# Patient Record
Sex: Female | Born: 1975
Health system: Southern US, Community
[De-identification: ages and names within clinical notes are randomized; demographics above are authoritative.]

## PROBLEM LIST (undated history)

## (undated) DIAGNOSIS — F419 Anxiety disorder, unspecified: Secondary | ICD-10-CM

## (undated) DIAGNOSIS — B009 Herpesviral infection, unspecified: Secondary | ICD-10-CM

## (undated) DIAGNOSIS — G43111 Migraine with aura, intractable, with status migrainosus: Secondary | ICD-10-CM

## (undated) HISTORY — DX: Anxiety disorder, unspecified: F41.9

## (undated) HISTORY — DX: Migraine with aura, intractable, with status migrainosus: G43.111

## (undated) HISTORY — PX: WISDOM TOOTH EXTRACTION: SHX21

## (undated) HISTORY — DX: Herpesviral infection, unspecified: B00.9

---

## 2005-04-13 ENCOUNTER — Other Ambulatory Visit: Admission: RE | Admit: 2005-04-13 | Discharge: 2005-04-13 | Payer: Self-pay | Admitting: Family Medicine

## 2009-07-24 ENCOUNTER — Other Ambulatory Visit: Admission: RE | Admit: 2009-07-24 | Discharge: 2009-07-24 | Payer: Self-pay | Admitting: Family Medicine

## 2011-07-15 ENCOUNTER — Other Ambulatory Visit (HOSPITAL_COMMUNITY)
Admission: RE | Admit: 2011-07-15 | Discharge: 2011-07-15 | Disposition: A | Discharge status: Home / Self Care | Payer: BC Managed Care – PPO | Source: Ambulatory Visit | Attending: Family Medicine | Admitting: Family Medicine

## 2011-07-15 DIAGNOSIS — Z1159 Encounter for screening for other viral diseases: Secondary | ICD-10-CM | POA: Insufficient documentation

## 2011-07-15 DIAGNOSIS — Z124 Encounter for screening for malignant neoplasm of cervix: Secondary | ICD-10-CM | POA: Insufficient documentation

## 2012-07-18 ENCOUNTER — Other Ambulatory Visit: Payer: Self-pay | Admitting: Family Medicine

## 2012-07-18 ENCOUNTER — Other Ambulatory Visit (HOSPITAL_COMMUNITY)
Admission: RE | Admit: 2012-07-18 | Discharge: 2012-07-18 | Disposition: A | Discharge status: Home / Self Care | Payer: BC Managed Care – PPO | Source: Ambulatory Visit | Attending: Family Medicine | Admitting: Family Medicine

## 2012-07-18 DIAGNOSIS — Z0000 Encounter for general adult medical examination without abnormal findings: Secondary | ICD-10-CM | POA: Insufficient documentation

## 2012-07-18 DIAGNOSIS — Z1151 Encounter for screening for human papillomavirus (HPV): Secondary | ICD-10-CM | POA: Insufficient documentation

## 2014-04-05 ENCOUNTER — Emergency Department (HOSPITAL_BASED_OUTPATIENT_CLINIC_OR_DEPARTMENT_OTHER)
Admission: EM | Admit: 2014-04-05 | Discharge: 2014-04-05 | Disposition: A | Discharge status: Home / Self Care | Payer: BLUE CROSS/BLUE SHIELD | Attending: Emergency Medicine | Admitting: Emergency Medicine

## 2014-04-05 ENCOUNTER — Encounter (HOSPITAL_BASED_OUTPATIENT_CLINIC_OR_DEPARTMENT_OTHER): Payer: Self-pay

## 2014-04-05 ENCOUNTER — Emergency Department (HOSPITAL_BASED_OUTPATIENT_CLINIC_OR_DEPARTMENT_OTHER): Payer: BLUE CROSS/BLUE SHIELD

## 2014-04-05 DIAGNOSIS — J209 Acute bronchitis, unspecified: Secondary | ICD-10-CM | POA: Insufficient documentation

## 2014-04-05 DIAGNOSIS — Z79899 Other long term (current) drug therapy: Secondary | ICD-10-CM | POA: Insufficient documentation

## 2014-04-05 DIAGNOSIS — R05 Cough: Secondary | ICD-10-CM | POA: Diagnosis present

## 2014-04-05 DIAGNOSIS — Z880 Allergy status to penicillin: Secondary | ICD-10-CM | POA: Insufficient documentation

## 2014-04-05 DIAGNOSIS — R000 Tachycardia, unspecified: Secondary | ICD-10-CM | POA: Diagnosis not present

## 2014-04-05 DIAGNOSIS — J40 Bronchitis, not specified as acute or chronic: Secondary | ICD-10-CM

## 2014-04-05 DIAGNOSIS — Z793 Long term (current) use of hormonal contraceptives: Secondary | ICD-10-CM | POA: Insufficient documentation

## 2014-04-05 LAB — CBC WITH DIFFERENTIAL/PLATELET
BASOS PCT: 1 % (ref 0–1)
Basophils Absolute: 0.1 10*3/uL (ref 0.0–0.1)
EOS PCT: 2 % (ref 0–5)
Eosinophils Absolute: 0.2 10*3/uL (ref 0.0–0.7)
HEMATOCRIT: 39.2 % (ref 36.0–46.0)
HEMOGLOBIN: 13 g/dL (ref 12.0–15.0)
Lymphocytes Relative: 12 % (ref 12–46)
Lymphs Abs: 1.5 10*3/uL (ref 0.7–4.0)
MCH: 28.8 pg (ref 26.0–34.0)
MCHC: 33.2 g/dL (ref 30.0–36.0)
MCV: 86.9 fL (ref 78.0–100.0)
MONOS PCT: 5 % (ref 3–12)
Monocytes Absolute: 0.7 10*3/uL (ref 0.1–1.0)
Neutro Abs: 10.3 10*3/uL — ABNORMAL HIGH (ref 1.7–7.7)
Neutrophils Relative %: 80 % — ABNORMAL HIGH (ref 43–77)
Platelets: 424 10*3/uL — ABNORMAL HIGH (ref 150–400)
RBC: 4.51 MIL/uL (ref 3.87–5.11)
RDW: 13.3 % (ref 11.5–15.5)
WBC: 12.8 10*3/uL — ABNORMAL HIGH (ref 4.0–10.5)

## 2014-04-05 LAB — BASIC METABOLIC PANEL
Anion gap: 2 — ABNORMAL LOW (ref 5–15)
BUN: 11 mg/dL (ref 6–23)
CHLORIDE: 110 mmol/L (ref 96–112)
CO2: 22 mmol/L (ref 19–32)
CREATININE: 0.51 mg/dL (ref 0.50–1.10)
Calcium: 8.7 mg/dL (ref 8.4–10.5)
GFR calc Af Amer: >90 mL/min (ref >90)
GFR calc non Af Amer: >90 mL/min (ref >90)
GLUCOSE: 104 mg/dL — AB (ref 70–99)
Potassium: 3.8 mmol/L (ref 3.5–5.1)
Sodium: 134 mmol/L — ABNORMAL LOW (ref 135–145)

## 2014-04-05 LAB — D-DIMER, QUANTITATIVE (NOT AT ARMC): D DIMER QUANT: 0.34 ug{FEU}/mL (ref 0.00–0.48)

## 2014-04-05 MED ORDER — AZITHROMYCIN 250 MG PO TABS: 250.0000 mg | ORAL_TABLET | Freq: Every day | ORAL | Status: DC

## 2014-04-05 MED ORDER — ALBUTEROL SULFATE HFA 108 (90 BASE) MCG/ACT IN AERS: 1.0000 | INHALATION_SPRAY | Freq: Four times a day (QID) | RESPIRATORY_TRACT | Status: DC | PRN

## 2014-04-05 NOTE — ED Notes (Signed)
C/o non prod cough and SOB x 2 days-sent from Urgent Care

## 2014-04-05 NOTE — Discharge Instructions (Signed)

## 2014-04-05 NOTE — ED Notes (Signed)
Pt appears mildly shob with speaking; denies recent travel, leg pain, chest pain

## 2014-04-05 NOTE — ED Provider Notes (Signed)
CSN: [id number]     Arrival date & time 04/05/14  1214 History   First MD Initiated Contact with Patient 04/05/14 1500     Chief Complaint  Patient presents with  . Cough     (Consider location/radiation/quality/duration/timing/severity/associated sxs/prior Treatment) HPI Comments: Patient reports shortness of breath for the past 2 days with nonproductive cough. She was sent from urgent care because her "pulse was high and pulse ox was low". She denies any productivity to her cough. She denies chest pain unless she coughs. Denies fever. No abdominal pain, nausea vomiting. No urinary symptoms. No leg pain or leg swelling. She endorses some nasal drainage and rhinorrhea. She is on birth control. Denies any recent travel.  The history is provided by the patient.    History reviewed. No pertinent past medical history. History reviewed. No pertinent past surgical history. No family history on file. History  Substance Use Topics  . Smoking status: Never Smoker   . Smokeless tobacco: Not on file  . Alcohol Use: No   OB History    No data available     Review of Systems  Constitutional: Negative for fever, activity change and appetite change.  HENT: Negative for congestion.   Respiratory: Positive for cough and shortness of breath. Negative for chest tightness.   Cardiovascular: Negative for chest pain.  Gastrointestinal: Negative for nausea, vomiting and abdominal pain.  Genitourinary: Negative for dysuria and hematuria.  Musculoskeletal: Negative for myalgias and arthralgias.  Skin: Negative for rash.  Neurological: Negative for dizziness and weakness.  A complete 10 system review of systems was obtained and all systems are negative except as noted in the HPI and PMH.      Allergies  Amoxil  Home Medications   Prior to Admission medications   Medication Sig Start Date End Date Taking? Authorizing Provider  acyclovir (ZOVIRAX) 400 MG tablet Take 400 mg by mouth 5 (five)  times daily.   Yes Historical Provider, MD  benzonatate (TESSALON) 100 MG capsule Take by mouth 3 (three) times daily as needed for cough.   Yes Historical Provider, MD  cetirizine (ZYRTEC) 10 MG tablet Take 10 mg by mouth daily.   Yes Historical Provider, MD  FLUTICASONE PROPIONATE, NASAL, NA Place into the nose.   Yes Historical Provider, MD  albuterol (PROVENTIL HFA;VENTOLIN HFA) 108 (90 BASE) MCG/ACT inhaler Inhale 1-2 puffs into the lungs every 6 (six) hours as needed for wheezing or shortness of breath. 04/05/14   Ezequiel Essex, MD  azithromycin (ZITHROMAX) 250 MG tablet Take 1 tablet (250 mg total) by mouth daily. Take first 2 tablets together, then 1 every day until finished. 04/05/14   Ezequiel Essex, MD   BP 104/66 mmHg  Pulse 92  Temp(Src) 98.9 F (37.2 C) (Oral)  Resp 16  Ht 5\' 7"  (1.702 m)  Wt 183 lb (83.008 kg)  BMI 28.66 kg/m2  SpO2 98%  LMP 03/23/2014 Physical Exam  Constitutional: She is oriented to person, place, and time. She appears well-developed and well-nourished. No distress.  Congested voice  HENT:  Head: Normocephalic and atraumatic.  Mouth/Throat: Oropharynx is clear and moist. No oropharyngeal exudate.  Eyes: Conjunctivae and EOM are normal. Pupils are equal, round, and reactive to light.  Neck: Normal range of motion. Neck supple.  No meningismus.  Cardiovascular: Normal rate, regular rhythm, normal heart sounds and intact distal pulses.   No murmur heard. Pulmonary/Chest: Effort normal and breath sounds normal. No respiratory distress.  Abdominal: Soft. There is no tenderness. There  is no rebound and no guarding.  Musculoskeletal: Normal range of motion. She exhibits no edema or tenderness.  Neurological: She is alert and oriented to person, place, and time. No cranial nerve deficit. She exhibits normal muscle tone. Coordination normal.  No ataxia on finger to nose bilaterally. No pronator drift. 5/5 strength throughout. CN 2-12 intact. Negative Romberg.  Equal grip strength. Sensation intact. Gait is normal.   Skin: Skin is warm.  Psychiatric: She has a normal mood and affect. Her behavior is normal.  Nursing note and vitals reviewed.   ED Course  Procedures (including critical care time) Labs Review Labs Reviewed  BASIC METABOLIC PANEL - Abnormal; Notable for the following:    Sodium 134 (*)    Glucose, Bld 104 (*)    Anion gap 2 (*)    All other components within normal limits  CBC WITH DIFFERENTIAL/PLATELET - Abnormal; Notable for the following:    WBC 12.8 (*)    Platelets 424 (*)    Neutrophils Relative % 80 (*)    Neutro Abs 10.3 (*)    All other components within normal limits  D-DIMER, QUANTITATIVE    Imaging Review Dg Chest 2 View  04/05/2014   CLINICAL DATA:  Shortness of breath for 2 days, throat tightness  EXAM: CHEST  2 VIEW  COMPARISON:  None.  FINDINGS: The heart size and mediastinal contours are within normal limits. Both lungs are clear. The visualized skeletal structures are unremarkable.  IMPRESSION: No active cardiopulmonary disease.   Electronically Signed   By: Inez Catalina M.D.   On: 04/05/2014 14:47     EKG Interpretation   Date/Time:  Thursday April 05 2014 12:30:57 EST Ventricular Rate:  100 PR Interval:  144 QRS Duration: 66 QT Interval:  340 QTC Calculation: 438 R Axis:   50 Text Interpretation:  Normal sinus rhythm Nonspecific T wave abnormality  Abnormal ECG No previous ECGs available Confirmed by Farheen Pfahler  MD, Layden Caterino  [phone]) on 04/05/2014 4:33:11 PM      MDM   Final diagnoses:  Bronchitis   2 days of shortness of breath with cough and tachycardia from urgent care. History of birth control use.  Lungs are clear.  Chest x-ray negative. D-dimer negative.  Patient no distress. We'll treat for bronchitis. Continue Tessalon. We'll add bronchodilator. Low suspicion for ACS or PE. Return precautions discussed.  Ezequiel Essex, MD 04/05/14 [phone]

## 2014-07-29 ENCOUNTER — Encounter (HOSPITAL_COMMUNITY): Payer: Self-pay

## 2014-07-29 ENCOUNTER — Inpatient Hospital Stay (HOSPITAL_COMMUNITY)
Admission: EM | Admit: 2014-07-29 | Discharge: 2014-07-31 | DRG: 918 | Disposition: A | Discharge status: Home / Self Care | Payer: 59 | Attending: Internal Medicine | Admitting: Internal Medicine

## 2014-07-29 DIAGNOSIS — W5911XA Bitten by nonvenomous snake, initial encounter: Secondary | ICD-10-CM

## 2014-07-29 DIAGNOSIS — T63001A Toxic effect of unspecified snake venom, accidental (unintentional), initial encounter: Secondary | ICD-10-CM | POA: Diagnosis not present

## 2014-07-29 DIAGNOSIS — Z79899 Other long term (current) drug therapy: Secondary | ICD-10-CM

## 2014-07-29 DIAGNOSIS — Z881 Allergy status to other antibiotic agents status: Secondary | ICD-10-CM

## 2014-07-29 DIAGNOSIS — T63091A Toxic effect of venom of other snake, accidental (unintentional), initial encounter: Principal | ICD-10-CM | POA: Diagnosis present

## 2014-07-29 LAB — I-STAT CHEM 8, ED
BUN: 12 mg/dL (ref 6–20)
Calcium, Ion: 1.13 mmol/L (ref 1.12–1.23)
Chloride: 104 mmol/L (ref 101–111)
Creatinine, Ser: 0.7 mg/dL (ref 0.44–1.00)
GLUCOSE: 149 mg/dL — AB (ref 65–99)
HCT: 41 % (ref 36.0–46.0)
Hemoglobin: 13.9 g/dL (ref 12.0–15.0)
Potassium: 3.2 mmol/L — ABNORMAL LOW (ref 3.5–5.1)
SODIUM: 139 mmol/L (ref 135–145)
TCO2: 18 mmol/L (ref 0–100)

## 2014-07-29 LAB — CBC WITH DIFFERENTIAL/PLATELET
BASOS ABS: 0.1 10*3/uL (ref 0.0–0.1)
BASOS PCT: 1 % (ref 0–1)
EOS ABS: 0.3 10*3/uL (ref 0.0–0.7)
EOS PCT: 3 % (ref 0–5)
HEMATOCRIT: 37.8 % (ref 36.0–46.0)
HEMOGLOBIN: 12.8 g/dL (ref 12.0–15.0)
Lymphocytes Relative: 32 % (ref 12–46)
Lymphs Abs: 2.8 10*3/uL (ref 0.7–4.0)
MCH: 29.2 pg (ref 26.0–34.0)
MCHC: 33.9 g/dL (ref 30.0–36.0)
MCV: 86.3 fL (ref 78.0–100.0)
MONO ABS: 0.5 10*3/uL (ref 0.1–1.0)
Monocytes Relative: 5 % (ref 3–12)
NEUTROS ABS: 5.1 10*3/uL (ref 1.7–7.7)
NEUTROS PCT: 59 % (ref 43–77)
Platelets: 361 10*3/uL (ref 150–400)
RBC: 4.38 MIL/uL (ref 3.87–5.11)
RDW: 13.9 % (ref 11.5–15.5)
WBC: 8.7 10*3/uL (ref 4.0–10.5)

## 2014-07-29 LAB — PROTIME-INR
INR: 1.06 (ref 0.00–1.49)
Prothrombin Time: 14 seconds (ref 11.6–15.2)

## 2014-07-29 LAB — FIBRINOGEN: Fibrinogen: 339 mg/dL (ref 204–475)

## 2014-07-29 MED ORDER — HYDROMORPHONE HCL 1 MG/ML IJ SOLN
1.0000 mg | Freq: Once | INTRAMUSCULAR | Status: AC
  Administered 2014-07-29: 1 mg via INTRAVENOUS
  Filled 2014-07-29: qty 1

## 2014-07-29 MED ORDER — HYDROMORPHONE HCL 1 MG/ML IJ SOLN
1.0000 mg | Freq: Once | INTRAMUSCULAR | Status: AC
  Administered 2014-07-30: 1 mg via INTRAVENOUS
  Filled 2014-07-29: qty 1

## 2014-07-29 MED ORDER — HYDROMORPHONE HCL 1 MG/ML IJ SOLN
0.5000 mg | Freq: Once | INTRAMUSCULAR | Status: AC
  Administered 2014-07-29: 0.5 mg via INTRAVENOUS
  Filled 2014-07-29: qty 1

## 2014-07-29 NOTE — ED Notes (Signed)
Onset 7:30p pt was cleaning out gutter and got bit by snake. Husband has picture of snake on phone.  Bite is on left thumb, left thumb and index finger numb.  Bruising noted to thumb.

## 2014-07-29 NOTE — ED Provider Notes (Signed)
CSN: [id number]     Arrival date & time 07/29/14  1946 History   First MD Initiated Contact with Patient 07/29/14 1956     Chief Complaint  Patient presents with  . Snake Bite     (Consider location/radiation/quality/duration/timing/severity/associated sxs/prior Treatment) HPI Emily Norman is a 39 y.o. female with no medical problems, presents to emergency department after snake bite to left hand. Patient states she was cleaning the gutters when a small snake bit her at the proximal left thumb. Picture of this snake was obtained, appears to look like a copperhead. Patient states area immediately swelled up and bruised. She came straight to emergency department. She reports pain to this area and numbness sensation radiating up to the wrist. She denies any fever or chills. There is no bleeding at the site. There is only one bite. No other injuries. Patient is not on any blood thinners.   History reviewed. No pertinent past medical history. History reviewed. No pertinent past surgical history. History reviewed. No pertinent family history. History  Substance Use Topics  . Smoking status: Never Smoker   . Smokeless tobacco: Not on file  . Alcohol Use: No   OB History    No data available     Review of Systems  Constitutional: Negative for fever and chills.  Respiratory: Negative for cough, chest tightness and shortness of breath.   Cardiovascular: Negative for chest pain, palpitations and leg swelling.  Gastrointestinal: Negative for nausea, vomiting, abdominal pain and diarrhea.  Musculoskeletal: Negative for myalgias, arthralgias, neck pain and neck stiffness.  Skin: Positive for color change and wound. Negative for rash.  Neurological: Negative for dizziness, weakness and headaches.  All other systems reviewed and are negative.     Allergies  Amoxil  Home Medications   Prior to Admission medications   Medication Sig Start Date End Date Taking? Authorizing Provider   acyclovir (ZOVIRAX) 400 MG tablet Take 400 mg by mouth 5 (five) times daily.    Historical Provider, MD  albuterol (PROVENTIL HFA;VENTOLIN HFA) 108 (90 BASE) MCG/ACT inhaler Inhale 1-2 puffs into the lungs every 6 (six) hours as needed for wheezing or shortness of breath. 04/05/14   Ezequiel Essex, MD  azithromycin (ZITHROMAX) 250 MG tablet Take 1 tablet (250 mg total) by mouth daily. Take first 2 tablets together, then 1 every day until finished. 04/05/14   Ezequiel Essex, MD  benzonatate (TESSALON) 100 MG capsule Take by mouth 3 (three) times daily as needed for cough.    Historical Provider, MD  cetirizine (ZYRTEC) 10 MG tablet Take 10 mg by mouth daily.    Historical Provider, MD  FLUTICASONE PROPIONATE, NASAL, NA Place into the nose.    Historical Provider, MD   BP 120/62 mmHg  Pulse 97  Temp(Src) 98.3 F (36.8 C)  Resp 16  Ht 5\' 7"  (1.702 m)  Wt 184 lb 1.6 oz (83.507 kg)  BMI 28.83 kg/m2  SpO2 98%  LMP 07/29/2014 Physical Exam  Constitutional: She appears well-developed and well-nourished. No distress.  HENT:  Head: Normocephalic.  Eyes: Conjunctivae are normal.  Neck: Neck supple.  Cardiovascular: Normal rate, regular rhythm and normal heart sounds.   Pulmonary/Chest: Effort normal and breath sounds normal. No respiratory distress. She has no wheezes. She has no rales.  Musculoskeletal: She exhibits no edema.  Mild swelling and small contusion noted to the proximal left thumb with one puncture wound. No bleeding. Swelling extends to the MCP joint and around. Her eminence. It does not cross the  wrist at this time. Full range of motion of all fingers, Refill less than 2 seconds distally. Bruising is localized just to the bite area.  Neurological: She is alert.  Skin: Skin is warm and dry.  Psychiatric: She has a normal mood and affect. Her behavior is normal.  Nursing note and vitals reviewed.   ED Course  Procedures (including critical care time) Labs Review Labs Reviewed   CBC - Abnormal; Notable for the following:    WBC 11.9 (*)    All other components within normal limits  COMPREHENSIVE METABOLIC PANEL - Abnormal; Notable for the following:    CO2 19 (*)    Glucose, Bld 127 (*)    Calcium 8.1 (*)    Total Protein 6.2 (*)    Albumin 3.4 (*)    All other components within normal limits  CBC WITH DIFFERENTIAL/PLATELET - Abnormal; Notable for the following:    WBC 14.0 (*)    Hemoglobin 11.9 (*)    Neutrophils Relative % 89 (*)    Neutro Abs 12.4 (*)    Lymphocytes Relative 8 (*)    All other components within normal limits  COMPREHENSIVE METABOLIC PANEL - Abnormal; Notable for the following:    Glucose, Bld 105 (*)    Calcium 8.8 (*)    Total Protein 6.3 (*)    Albumin 3.3 (*)    All other components within normal limits  I-STAT CHEM 8, ED - Abnormal; Notable for the following:    Potassium 3.2 (*)    Glucose, Bld 149 (*)    All other components within normal limits  MRSA PCR SCREENING  CBC WITH DIFFERENTIAL/PLATELET  FIBRINOGEN  PROTIME-INR  PROTIME-INR  FIBRINOGEN  PROTIME-INR  FIBRINOGEN  PROTIME-INR    Imaging Review No results found.   EKG Interpretation Final diagnoses:  Snake bite, accidental or unintentional, initial encounter        MDM   Final diagnoses:  Snake bite, accidental or unintentional, initial encounter      8:20 PM Spoke with poison control, advised to elevate the extremity, marked the area of swelling, watch for swelling that progresses past the wrist joint. Pain control. Lab work  is pending. Vital signs are normal. Pain medications are ordered.  9:14 PM Patient complaining of increasing pain, will order another dose of Dilaudid. Swelling still contained down through them first metacarpal, it does not cross the wrist joint at this time.  11:03 PM Pain now well controlled. Swelling not progressed past wrist. VS normal. Will continue to monitor.   12:46 AM swelling to the wrist. VS remain normal.  Discussed with Dr. Aline Brochure. Hold off on crofab, swelling is minimal. Will need further observation and serial exams. Will admit to [facility].   Filed Vitals:   07/29/14 2245 07/29/14 2300 07/29/14 2315 07/29/14 2330  BP: 109/66 116/62 107/70 116/66  Pulse: 64 74 66 65  Temp:      Resp:      Height:      Weight:      SpO2: 92% 97% 94% 94%      Jeannett Senior, PA-C 07/31/14 [place], MD 08/01/14 [phone]

## 2014-07-30 DIAGNOSIS — T63001A Toxic effect of unspecified snake venom, accidental (unintentional), initial encounter: Secondary | ICD-10-CM | POA: Diagnosis present

## 2014-07-30 DIAGNOSIS — Z881 Allergy status to other antibiotic agents status: Secondary | ICD-10-CM | POA: Diagnosis not present

## 2014-07-30 DIAGNOSIS — Z79899 Other long term (current) drug therapy: Secondary | ICD-10-CM | POA: Diagnosis not present

## 2014-07-30 DIAGNOSIS — T63091A Toxic effect of venom of other snake, accidental (unintentional), initial encounter: Secondary | ICD-10-CM | POA: Diagnosis present

## 2014-07-30 DIAGNOSIS — T63004A Toxic effect of unspecified snake venom, undetermined, initial encounter: Secondary | ICD-10-CM | POA: Diagnosis not present

## 2014-07-30 LAB — CBC
HEMATOCRIT: 36.3 % (ref 36.0–46.0)
Hemoglobin: 12.2 g/dL (ref 12.0–15.0)
MCH: 29 pg (ref 26.0–34.0)
MCHC: 33.6 g/dL (ref 30.0–36.0)
MCV: 86.4 fL (ref 78.0–100.0)
Platelets: 353 10*3/uL (ref 150–400)
RBC: 4.2 MIL/uL (ref 3.87–5.11)
RDW: 14 % (ref 11.5–15.5)
WBC: 11.9 10*3/uL — ABNORMAL HIGH (ref 4.0–10.5)

## 2014-07-30 LAB — CBC WITH DIFFERENTIAL/PLATELET
BASOS ABS: 0 10*3/uL (ref 0.0–0.1)
Basophils Relative: 0 % (ref 0–1)
Eosinophils Absolute: 0 10*3/uL (ref 0.0–0.7)
Eosinophils Relative: 0 % (ref 0–5)
HCT: 36.6 % (ref 36.0–46.0)
HEMOGLOBIN: 11.9 g/dL — AB (ref 12.0–15.0)
LYMPHS ABS: 1.2 10*3/uL (ref 0.7–4.0)
Lymphocytes Relative: 8 % — ABNORMAL LOW (ref 12–46)
MCH: 28.6 pg (ref 26.0–34.0)
MCHC: 32.5 g/dL (ref 30.0–36.0)
MCV: 88 fL (ref 78.0–100.0)
MONOS PCT: 3 % (ref 3–12)
Monocytes Absolute: 0.5 10*3/uL (ref 0.1–1.0)
NEUTROS ABS: 12.4 10*3/uL — AB (ref 1.7–7.7)
NEUTROS PCT: 89 % — AB (ref 43–77)
Platelets: 318 10*3/uL (ref 150–400)
RBC: 4.16 MIL/uL (ref 3.87–5.11)
RDW: 13.9 % (ref 11.5–15.5)
WBC: 14 10*3/uL — ABNORMAL HIGH (ref 4.0–10.5)

## 2014-07-30 LAB — COMPREHENSIVE METABOLIC PANEL
ALT: 17 U/L (ref 14–54)
AST: 23 U/L (ref 15–41)
Albumin: 3.4 g/dL — ABNORMAL LOW (ref 3.5–5.0)
Alkaline Phosphatase: 48 U/L (ref 38–126)
Anion gap: 9 (ref 5–15)
BILIRUBIN TOTAL: 0.5 mg/dL (ref 0.3–1.2)
BUN: 9 mg/dL (ref 6–20)
CO2: 19 mmol/L — ABNORMAL LOW (ref 22–32)
Calcium: 8.1 mg/dL — ABNORMAL LOW (ref 8.9–10.3)
Chloride: 108 mmol/L (ref 101–111)
Creatinine, Ser: 0.61 mg/dL (ref 0.44–1.00)
GFR calc Af Amer: >60 mL/min (ref >60)
GLUCOSE: 127 mg/dL — AB (ref 65–99)
POTASSIUM: 3.7 mmol/L (ref 3.5–5.1)
SODIUM: 136 mmol/L (ref 135–145)
Total Protein: 6.2 g/dL — ABNORMAL LOW (ref 6.5–8.1)

## 2014-07-30 LAB — FIBRINOGEN
FIBRINOGEN: 306 mg/dL (ref 204–475)
Fibrinogen: 303 mg/dL (ref 204–475)

## 2014-07-30 LAB — PROTIME-INR
INR: 1.15 (ref 0.00–1.49)
INR: 1.08 (ref 0.00–1.49)
Prothrombin Time: 14.8 seconds (ref 11.6–15.2)
Prothrombin Time: 14.2 seconds (ref 11.6–15.2)

## 2014-07-30 LAB — MRSA PCR SCREENING: MRSA by PCR: NEGATIVE

## 2014-07-30 MED ORDER — SODIUM CHLORIDE 0.9 % IV SOLN
2.0000 | Freq: Once | INTRAVENOUS | Status: AC
  Administered 2014-07-30: 36 mL via INTRAVENOUS
  Filled 2014-07-30: qty 36

## 2014-07-30 MED ORDER — SODIUM CHLORIDE 0.9 % IV SOLN
4.0000 | Freq: Once | INTRAVENOUS | Status: AC
  Administered 2014-07-30: 72 mL via INTRAVENOUS
  Filled 2014-07-30: qty 72

## 2014-07-30 MED ORDER — HYDROMORPHONE HCL 1 MG/ML IJ SOLN
2.0000 mg | Freq: Once | INTRAMUSCULAR | Status: AC
  Administered 2014-07-30: 2 mg via INTRAVENOUS
  Filled 2014-07-30: qty 2

## 2014-07-30 MED ORDER — ONDANSETRON HCL 4 MG PO TABS: 4.0000 mg | ORAL_TABLET | Freq: Four times a day (QID) | ORAL | Status: DC | PRN

## 2014-07-30 MED ORDER — SODIUM CHLORIDE 0.9 % IV SOLN
Freq: Once | INTRAVENOUS | Status: AC
  Administered 2014-07-30: 125 mL/h via INTRAVENOUS

## 2014-07-30 MED ORDER — SODIUM CHLORIDE 0.9 % IV SOLN
INTRAVENOUS | Status: DC
  Administered 2014-07-31: 08:00:00 via INTRAVENOUS

## 2014-07-30 MED ORDER — HYDROMORPHONE HCL 1 MG/ML IJ SOLN
0.5000 mg | INTRAMUSCULAR | Status: DC | PRN
  Administered 2014-07-30: 1 mg via INTRAVENOUS
  Administered 2014-07-30 (×2): 2 mg via INTRAVENOUS
  Administered 2014-07-30: 1 mg via INTRAVENOUS
  Filled 2014-07-30 (×3): qty 2
  Filled 2014-07-30: qty 1

## 2014-07-30 MED ORDER — SODIUM CHLORIDE 0.9 % IV BOLUS (SEPSIS)
1000.0000 mL | Freq: Once | INTRAVENOUS | Status: AC
  Administered 2014-07-30: 1000 mL via INTRAVENOUS

## 2014-07-30 MED ORDER — ONDANSETRON HCL 4 MG/2ML IJ SOLN
4.0000 mg | Freq: Four times a day (QID) | INTRAMUSCULAR | Status: DC | PRN
Start: 2014-07-30 — End: 2014-07-31
  Administered 2014-07-30 (×2): 4 mg via INTRAVENOUS
  Filled 2014-07-30 (×2): qty 2

## 2014-07-30 MED ORDER — DOCUSATE SODIUM 100 MG PO CAPS
100.0000 mg | ORAL_CAPSULE | Freq: Two times a day (BID) | ORAL | Status: DC
  Administered 2014-07-30 – 2014-07-31 (×2): 100 mg via ORAL
  Filled 2014-07-30 (×2): qty 1

## 2014-07-30 MED ORDER — DOXYCYCLINE HYCLATE 100 MG PO TABS
100.0000 mg | ORAL_TABLET | Freq: Two times a day (BID) | ORAL | Status: DC
  Administered 2014-07-30 – 2014-07-31 (×3): 100 mg via ORAL
  Filled 2014-07-30 (×3): qty 1

## 2014-07-30 NOTE — Progress Notes (Signed)
Patient seen and examined  Still complaining off a significant amount of pain in her left hand, although better than last night  Discussed with poison control, they recommend continued doses of  CroFab 3 every 6 hours to complete treatment  Started the patient on doxycycline for secondary infection at the site of the bite , since white count is increasing  If no improvement in pain by tomorrow, then we will consider an MRI of the left hand

## 2014-07-30 NOTE — H&P (Addendum)
[facility] History and Physical  Emily Norman YSA:[id number] DOB: December 15, 1975 DOA: 07/29/2014  Referring physician:  PCP: Cari Caraway, MD  Specialists:   Chief Complaint: Bitten by copperhead snake  HPI: Emily Norman is a 39 y.o. female with no medical problems, presents to emergency department after snake bite to left hand. Patient states she was cleaning the gutters when a small snake bit her at the proximal left thumb. Picture of this snake was obtained, appears to look like a copperhead. Patient states area immediately swelled up and bruised. She came straight to emergency department. She reports pain to this area and numbness sensation radiating up to the wrist. She denies any fever or chills. There is no bleeding at the site. There is only one bite. No other injuries. Patient is not on any blood thinners.  ADDENDUM; patient now having pain to palpation in left axilla   Review of Systems: The patient denies anorexia, fever, weight loss,, vision loss, decreased hearing, hoarseness, chest pain, syncope, dyspnea on exertion, balance deficits, hemoptysis, abdominal pain, melena, hematochezia, severe indigestion/heartburn, hematuria, incontinence, genital sores, muscle weakness, suspicious skin lesions, transient blindness, difficulty walking, depression, unusual weight change, abnormal bleeding, enlarged lymph nodes, angioedema, and breast masses.    TRAVEL HISTORY:  NA  Consultants:  [facility] (information in chart)   Procedure/Significant Events:  NA    Culture  NA   Antibiotics:  NA   DVT prophylaxis:  SCD  Devices     LINES / TUBES:     History reviewed. No pertinent past medical history. History reviewed. No pertinent past surgical history. Social History:  reports that she has never smoked. She does not have any smokeless tobacco history on file. She reports that she does not drink alcohol or use illicit drugs. where does patient  live--home, ALF, SNF? and with whom if at home? Home Can patient participate in ADLs? Yes  Allergies  Allergen Reactions  . Amoxil [Amoxicillin] Other (See Comments)    Pt had several side effects from this but does not recall which ones    History reviewed. No pertinent family history. spoke with patient/family member and stated no pertinent family history.  Prior to Admission medications   Medication Sig Start Date End Date Taking? Authorizing Provider  acyclovir (ZOVIRAX) 400 MG tablet Take 800 mg by mouth daily after lunch.    Yes Historical Provider, MD  albuterol (PROVENTIL HFA;VENTOLIN HFA) 108 (90 BASE) MCG/ACT inhaler Inhale 1-2 puffs into the lungs every 6 (six) hours as needed for wheezing or shortness of breath. 04/05/14  Yes Ezequiel Essex, MD  ibuprofen (ADVIL,MOTRIN) 200 MG tablet Take 600 mg by mouth daily as needed (pain).   Yes Historical Provider, MD  Norethindrone Acetate-Ethinyl Estradiol (GILDESS 1.5/30) 1.5-30 MG-MCG tablet Take 1 tablet by mouth See admin instructions. Take 1 tablet by mouth for 21 days,  hold for 7 days, then repeat.   Yes Historical Provider, MD  OVER THE COUNTER MEDICATION Apply 1 application topically 2 (two) times daily as needed (acne blemishes). Over the counter acne cream   Yes Historical Provider, MD   Physical Exam: Filed Vitals:   07/29/14 2245 07/29/14 2300 07/29/14 2315 07/29/14 2330  BP: 109/66 116/62 107/70 116/66  Pulse: 64 74 66 65  Temp:      Resp:      Height:      Weight:      SpO2: 92% 97% 94% 94%     General:  A/O 4, having significant left hand  pain rated 7/10, swelling present  Constitutional;  negative , rash, negative, negative lump/bone/masses  Eyes: Negative headache, negative retinal hemorrhage  ENT: Negative Runny nose, negative ear pain, negative tinnitus, negative gingival bleeding  Gastrointestinal; negative abdominal pain, negative dysphagia, negative bloating,Soft, nontender nondistended plus bowel  sound   Neck:  Negative scars, masses, torticollis, lymphadenopathy, JVD  Cardiovascular: RRR, negative murmurs, rubs, gallops normal S1/S2, negative S3  Respiratory: Clear to auscultation bilateral, negative rales, rhonchi, negative wheezing, crackles  Skin ; negative rash, positive single puncture mark tip of left thumb  Musculoskeletal:  Positive left hand pain/swelling, positive DIP joint swelling left thumb, positive  decreased range of motion left thumb   Psychiatric:  Negative depression, negative anxiety, negative fatigue, negative mania  Neurologic:  Cranial nerves II through XII intact, tongue/uvula midline, all extremities muscle strength 5/5, sensation intact throughout,negative dysarthria, negative expressive aphasia, negative receptive aphasia.   Hemolytic/lymphatic; negative purpura, petechia,  Allergic/immunologic; negative Difficulty breathing" or "choking. Negative Swelling or pain at groin, axilla  or neck (swollen lymph nodes/glands ),    Labs on Admission:  Basic Metabolic Panel:  Recent Labs Lab 07/29/14 2032  NA 139  K 3.2*  CL 104  GLUCOSE 149*  BUN 12  CREATININE 0.70   Liver Function Tests: No results for input(s): AST, ALT, ALKPHOS, BILITOT, PROT, ALBUMIN in the last 168 hours. No results for input(s): LIPASE, AMYLASE in the last 168 hours. No results for input(s): AMMONIA in the last 168 hours. CBC:  Recent Labs Lab 07/29/14 2018 07/29/14 2032  WBC 8.7  --   NEUTROABS 5.1  --   HGB 12.8 13.9  HCT 37.8 41.0  MCV 86.3  --   PLT 361  --    Cardiac Enzymes: No results for input(s): CKTOTAL, CKMB, CKMBINDEX, TROPONINI in the last 168 hours.  BNP (last 3 results) No results for input(s): BNP in the last 8760 hours.  ProBNP (last 3 results) No results for input(s): PROBNP in the last 8760 hours.  CBG: No results for input(s): GLUCAP in the last 168 hours.  Radiological Exams on Admission: No results found.  EKG:  N/A  Assessment/Plan Active Problems:   * No active hospital problems. *  Snakebite (copperhead) -Patient currently receiving supportive care normal saline 65ml/hr -Dilaudid 0.5-2 mg IV PRN q 4hr pain -If swelling and pain cross left wrist then would start anti-venom ADDENDUM; with increasing pain now extending into left axilla will start anti-venom    Code Status: Full Family Communication: Family present Disposition Plan: Resolution swelling from snakebite   Care during the described time interval was provided by me .  I have reviewed this patient's available data, including medical history, events of note, physical examination, and all test results as part of my evaluation. I have personally reviewed and interpreted all radiology studies.    Time spent: 70 minutes  Allie Bossier [facility] Pager [phone]  If 7PM-7AM, please contact night-coverage [email or web address] Password [facility] 07/30/2014, 12:37 AM

## 2014-07-31 LAB — COMPREHENSIVE METABOLIC PANEL
ALT: 17 U/L (ref 14–54)
AST: 20 U/L (ref 15–41)
Albumin: 3.3 g/dL — ABNORMAL LOW (ref 3.5–5.0)
Alkaline Phosphatase: 60 U/L (ref 38–126)
Anion gap: 6 (ref 5–15)
BILIRUBIN TOTAL: 0.4 mg/dL (ref 0.3–1.2)
BUN: 8 mg/dL (ref 6–20)
CHLORIDE: 110 mmol/L (ref 101–111)
CO2: 24 mmol/L (ref 22–32)
CREATININE: 0.57 mg/dL (ref 0.44–1.00)
Calcium: 8.8 mg/dL — ABNORMAL LOW (ref 8.9–10.3)
GFR calc Af Amer: >60 mL/min (ref >60)
GFR calc non Af Amer: >60 mL/min (ref >60)
Glucose, Bld: 105 mg/dL — ABNORMAL HIGH (ref 65–99)
Potassium: 4.2 mmol/L (ref 3.5–5.1)
Sodium: 140 mmol/L (ref 135–145)
Total Protein: 6.3 g/dL — ABNORMAL LOW (ref 6.5–8.1)

## 2014-07-31 LAB — PROTIME-INR
INR: 1.01 (ref 0.00–1.49)
Prothrombin Time: 13.5 seconds (ref 11.6–15.2)

## 2014-07-31 MED ORDER — OXYCODONE HCL 5 MG PO TABS: 5.0000 mg | ORAL_TABLET | Freq: Three times a day (TID) | ORAL | Status: DC | PRN

## 2014-07-31 MED ORDER — DOCUSATE SODIUM 100 MG PO CAPS: 100.0000 mg | ORAL_CAPSULE | Freq: Two times a day (BID) | ORAL | Status: DC

## 2014-07-31 MED ORDER — DOXYCYCLINE HYCLATE 100 MG PO TABS: 100.0000 mg | ORAL_TABLET | Freq: Two times a day (BID) | ORAL | Status: DC

## 2014-07-31 NOTE — Discharge Summary (Signed)
Physician Discharge Summary  Emily Norman MRN: [id number] DOB/AGE: [birth year] 39 y.o.  PCP: Cari Caraway, MD   Admit date: 07/29/2014 Discharge date: 07/31/2014  Discharge Diagnoses:   Active Problems:   Snake bite poisoning    Follow-up recommendations Follow-up with PCP in 3-5 days , including although additional recommended appointments as below Follow-up CBC, CMP, INR in 3-5 days      Medication List    STOP taking these medications        ibuprofen 200 MG tablet  Commonly known as:  ADVIL,MOTRIN      TAKE these medications        acyclovir 400 MG tablet  Commonly known as:  ZOVIRAX  Take 800 mg by mouth daily after lunch.     albuterol 108 (90 BASE) MCG/ACT inhaler  Commonly known as:  PROVENTIL HFA;VENTOLIN HFA  Inhale 1-2 puffs into the lungs every 6 (six) hours as needed for wheezing or shortness of breath.     docusate sodium 100 MG capsule  Commonly known as:  COLACE  Take 1 capsule (100 mg total) by mouth 2 (two) times daily.     doxycycline 100 MG tablet  Commonly known as:  VIBRA-TABS  Take 1 tablet (100 mg total) by mouth every 12 (twelve) hours.     GILDESS 1.5/30 1.5-30 MG-MCG tablet  Generic drug:  Norethindrone Acetate-Ethinyl Estradiol  Take 1 tablet by mouth See admin instructions. Take 1 tablet by mouth for 21 days,  hold for 7 days, then repeat.     OVER THE COUNTER MEDICATION  Apply 1 application topically 2 (two) times daily as needed (acne blemishes). Over the counter acne cream     oxyCODONE 5 MG immediate release tablet  Commonly known as:  ROXICODONE  Take 1 tablet (5 mg total) by mouth every 8 (eight) hours as needed for severe pain.         Discharge Condition: Stable    Disposition: 01-Home or Self Care   Consults:  Poison control    Significant Diagnostic Studies:  No results found.  echocardiogram       Filed Weights   07/29/14 1952  Weight: 83.507 kg (184 lb 1.6 oz)     Microbiology: Recent  Results (from the past 240 hour(s))  MRSA PCR Screening     Status: None   Collection Time: 07/30/14  3:25 AM  Result Value Ref Range Status   MRSA by PCR NEGATIVE NEGATIVE Final    Comment:        The GeneXpert MRSA Assay (FDA approved for NASAL specimens only), is one component of a comprehensive MRSA colonization surveillance program. It is not intended to diagnose MRSA infection nor to guide or monitor treatment for MRSA infections.        Blood Culture No results found for: SDES, SPECREQUEST, CULT, REPTSTATUS    Labs: Results for orders placed or performed during the hospital encounter of 07/29/14 (from the past 48 hour(s))  CBC with Differential     Status: None   Collection Time: 07/29/14  8:18 PM  Result Value Ref Range   WBC 8.7 4.0 - 10.5 K/uL   RBC 4.38 3.87 - 5.11 MIL/uL   Hemoglobin 12.8 12.0 - 15.0 g/dL   HCT 37.8 36.0 - 46.0 %   MCV 86.3 78.0 - 100.0 fL   MCH 29.2 26.0 - 34.0 pg   MCHC 33.9 30.0 - 36.0 g/dL   RDW 13.9 11.5 - 15.5 %   Platelets 361 150 -  400 K/uL   Neutrophils Relative % 59 43 - 77 %   Neutro Abs 5.1 1.7 - 7.7 K/uL   Lymphocytes Relative 32 12 - 46 %   Lymphs Abs 2.8 0.7 - 4.0 K/uL   Monocytes Relative 5 3 - 12 %   Monocytes Absolute 0.5 0.1 - 1.0 K/uL   Eosinophils Relative 3 0 - 5 %   Eosinophils Absolute 0.3 0.0 - 0.7 K/uL   Basophils Relative 1 0 - 1 %   Basophils Absolute 0.1 0.0 - 0.1 K/uL  Fibrinogen     Status: None   Collection Time: 07/29/14  8:18 PM  Result Value Ref Range   Fibrinogen 339 204 - 475 mg/dL  Protime-INR     Status: None   Collection Time: 07/29/14  8:18 PM  Result Value Ref Range   Prothrombin Time 14.0 11.6 - 15.2 seconds   INR 1.06 0.00 - 1.49  I-Stat Chem 8, ED     Status: Abnormal   Collection Time: 07/29/14  8:32 PM  Result Value Ref Range   Sodium 139 135 - 145 mmol/L   Potassium 3.2 (L) 3.5 - 5.1 mmol/L   Chloride 104 101 - 111 mmol/L   BUN 12 6 - 20 mg/dL   Creatinine, Ser 0.70 0.44 -  1.00 mg/dL   Glucose, Bld 149 (H) 65 - 99 mg/dL   Calcium, Ion 1.13 1.12 - 1.23 mmol/L   TCO2 18 0 - 100 mmol/L   Hemoglobin 13.9 12.0 - 15.0 g/dL   HCT 41.0 36.0 - 46.0 %  Protime-INR     Status: None   Collection Time: 07/30/14  2:17 AM  Result Value Ref Range   Prothrombin Time 14.8 11.6 - 15.2 seconds   INR 1.15 0.00 - 1.49  Fibrinogen     Status: None   Collection Time: 07/30/14  2:17 AM  Result Value Ref Range   Fibrinogen 303 204 - 475 mg/dL  CBC     Status: Abnormal   Collection Time: 07/30/14  2:17 AM  Result Value Ref Range   WBC 11.9 (H) 4.0 - 10.5 K/uL   RBC 4.20 3.87 - 5.11 MIL/uL   Hemoglobin 12.2 12.0 - 15.0 g/dL   HCT 36.3 36.0 - 46.0 %   MCV 86.4 78.0 - 100.0 fL   MCH 29.0 26.0 - 34.0 pg   MCHC 33.6 30.0 - 36.0 g/dL   RDW 14.0 11.5 - 15.5 %   Platelets 353 150 - 400 K/uL  MRSA PCR Screening     Status: None   Collection Time: 07/30/14  3:25 AM  Result Value Ref Range   MRSA by PCR NEGATIVE NEGATIVE    Comment:        The GeneXpert MRSA Assay (FDA approved for NASAL specimens only), is one component of a comprehensive MRSA colonization surveillance program. It is not intended to diagnose MRSA infection nor to guide or monitor treatment for MRSA infections.   Comprehensive metabolic panel     Status: Abnormal   Collection Time: 07/30/14  5:13 AM  Result Value Ref Range   Sodium 136 135 - 145 mmol/L   Potassium 3.7 3.5 - 5.1 mmol/L   Chloride 108 101 - 111 mmol/L   CO2 19 (L) 22 - 32 mmol/L   Glucose, Bld 127 (H) 65 - 99 mg/dL   BUN 9 6 - 20 mg/dL   Creatinine, Ser 0.61 0.44 - 1.00 mg/dL   Calcium 8.1 (L) 8.9 - 10.3 mg/dL  Total Protein 6.2 (L) 6.5 - 8.1 g/dL   Albumin 3.4 (L) 3.5 - 5.0 g/dL   AST 23 15 - 41 U/L   ALT 17 14 - 54 U/L   Alkaline Phosphatase 48 38 - 126 U/L   Total Bilirubin 0.5 0.3 - 1.2 mg/dL   GFR calc non Af Amer >60 >60 mL/min   GFR calc Af Amer >60 >60 mL/min    Comment: (NOTE) The eGFR has been calculated using the CKD  EPI equation. This calculation has not been validated in all clinical situations. eGFR's persistently <60 mL/min signify possible Chronic Kidney Disease.    Anion gap 9 5 - 15  CBC WITH DIFFERENTIAL     Status: Abnormal   Collection Time: 07/30/14  5:13 AM  Result Value Ref Range   WBC 14.0 (H) 4.0 - 10.5 K/uL   RBC 4.16 3.87 - 5.11 MIL/uL   Hemoglobin 11.9 (L) 12.0 - 15.0 g/dL   HCT 36.6 36.0 - 46.0 %   MCV 88.0 78.0 - 100.0 fL   MCH 28.6 26.0 - 34.0 pg   MCHC 32.5 30.0 - 36.0 g/dL   RDW 13.9 11.5 - 15.5 %   Platelets 318 150 - 400 K/uL   Neutrophils Relative % 89 (H) 43 - 77 %   Neutro Abs 12.4 (H) 1.7 - 7.7 K/uL   Lymphocytes Relative 8 (L) 12 - 46 %   Lymphs Abs 1.2 0.7 - 4.0 K/uL   Monocytes Relative 3 3 - 12 %   Monocytes Absolute 0.5 0.1 - 1.0 K/uL   Eosinophils Relative 0 0 - 5 %   Eosinophils Absolute 0.0 0.0 - 0.7 K/uL   Basophils Relative 0 0 - 1 %   Basophils Absolute 0.0 0.0 - 0.1 K/uL  Protime-INR     Status: None   Collection Time: 07/30/14  5:13 AM  Result Value Ref Range   Prothrombin Time 14.2 11.6 - 15.2 seconds   INR 1.08 0.00 - 1.49  Fibrinogen     Status: None   Collection Time: 07/30/14  5:13 AM  Result Value Ref Range   Fibrinogen 306 204 - 475 mg/dL  Protime-INR     Status: None   Collection Time: 07/31/14  8:28 AM  Result Value Ref Range   Prothrombin Time 13.5 11.6 - 15.2 seconds   INR 1.01 0.00 - 1.49  Comprehensive metabolic panel     Status: Abnormal   Collection Time: 07/31/14  8:28 AM  Result Value Ref Range   Sodium 140 135 - 145 mmol/L   Potassium 4.2 3.5 - 5.1 mmol/L   Chloride 110 101 - 111 mmol/L   CO2 24 22 - 32 mmol/L   Glucose, Bld 105 (H) 65 - 99 mg/dL   BUN 8 6 - 20 mg/dL   Creatinine, Ser 0.57 0.44 - 1.00 mg/dL   Calcium 8.8 (L) 8.9 - 10.3 mg/dL   Total Protein 6.3 (L) 6.5 - 8.1 g/dL   Albumin 3.3 (L) 3.5 - 5.0 g/dL   AST 20 15 - 41 U/L   ALT 17 14 - 54 U/L   Alkaline Phosphatase 60 38 - 126 U/L   Total Bilirubin 0.4  0.3 - 1.2 mg/dL   GFR calc non Af Amer >60 >60 mL/min   GFR calc Af Amer >60 >60 mL/min    Comment: (NOTE) The eGFR has been calculated using the CKD EPI equation. This calculation has not been validated in all clinical situations. eGFR's persistently <60  mL/min signify possible Chronic Kidney Disease.    Anion gap 6 5 - 15     Lipid Panel  No results found for: CHOL, TRIG, HDL, CHOLHDL, VLDL, LDLCALC, LDLDIRECT   No results found for: HGBA1C   Lab Results  Component Value Date   CREATININE 0.57 07/31/2014     HPI :* 39 y.o. female with no medical problems, presents to emergency department after snake bite to left hand. Patient states she was cleaning the gutters when a small snake bit her at the proximal left thumb. Picture of this snake was obtained, appears to look like a copperhead. Patient states area immediately swelled up and bruised. She came straight to emergency department. She reports pain to this area and numbness sensation radiating up to the wrist. She denies any fever or chills. There is no bleeding at the site. There is only one bite. No other injuries. Patient is not on any blood thinners.    HOSPITAL COURSE:  Snake bite Patient treated with CroFab, 4 vials upon admission Subsequently CroFab 2 vials every 6 hours 3 doses Patient did have some localized hemolysis and swelling of the dorsal aspect of the left hand In consolidation with poison control, patient was monitored for coagulopathy Her vital signs have been stable Recommended hand elevation warm compresses, doxycycline to prevent secondary infection Oxycodone for pain control Symptoms are improving and the patient will be discharged home with follow-up with PCP  Discharge Exam:    Blood pressure 89/41, pulse 66, temperature 98.8 F (37.1 C), temperature source Oral, resp. rate 18, height _0  (1.702 m), weight 83.507 kg (184 lb 1.6 oz), last menstrual period 07/29/2014, SpO2 99 %.  General: A/O  4, having significant left hand pain rated 7/10, swelling present  Constitutional; negative , rash, negative, negative lump/bone/masses  Eyes: Negative headache, negative retinal hemorrhage  ENT: Negative Runny nose, negative ear pain, negative tinnitus, negative gingival bleeding  Gastrointestinal; negative abdominal pain, negative dysphagia, negative bloating,Soft, nontender nondistended plus bowel sound   Neck: Negative scars, masses, torticollis, lymphadenopathy, JVD  Cardiovascular: RRR, negative murmurs, rubs, gallops normal S1/S2, negative S3  Respiratory: Clear to auscultation bilateral, negative rales, rhonchi, negative wheezing, crackles  Skin ; negative rash, positive single puncture mark tip of left thumb  Musculoskeletal: Positive left hand pain/swelling, positive DIP joint swelling left thumb, positive decreased range of motion left thumb         Discharge Instructions    Diet - low sodium heart healthy    Complete by:  As directed      Increase activity slowly    Complete by:  As directed              Signed: Laelle Bridgett 07/31/2014, 10:48 AM        Time spent >45 mins

## 2014-08-01 NOTE — Progress Notes (Signed)
Utilization review completed- post discharge 

## 2015-06-03 ENCOUNTER — Other Ambulatory Visit: Payer: Self-pay | Admitting: Family Medicine

## 2015-06-03 DIAGNOSIS — R42 Dizziness and giddiness: Secondary | ICD-10-CM

## 2015-06-03 DIAGNOSIS — H547 Unspecified visual loss: Secondary | ICD-10-CM

## 2015-06-07 ENCOUNTER — Ambulatory Visit
Admission: RE | Admit: 2015-06-07 | Discharge: 2015-06-07 | Disposition: A | Discharge status: Home / Self Care | Payer: 59 | Source: Ambulatory Visit | Attending: Family Medicine | Admitting: Family Medicine

## 2015-06-07 DIAGNOSIS — H547 Unspecified visual loss: Secondary | ICD-10-CM

## 2015-06-07 DIAGNOSIS — R42 Dizziness and giddiness: Secondary | ICD-10-CM

## 2015-12-26 ENCOUNTER — Other Ambulatory Visit: Payer: Self-pay | Admitting: Family Medicine

## 2015-12-26 DIAGNOSIS — Z1231 Encounter for screening mammogram for malignant neoplasm of breast: Secondary | ICD-10-CM

## 2016-01-08 ENCOUNTER — Ambulatory Visit
Admission: RE | Admit: 2016-01-08 | Discharge: 2016-01-08 | Disposition: A | Discharge status: Home / Self Care | Payer: 59 | Source: Ambulatory Visit | Attending: Family Medicine | Admitting: Family Medicine

## 2016-01-08 DIAGNOSIS — Z1231 Encounter for screening mammogram for malignant neoplasm of breast: Secondary | ICD-10-CM

## 2016-04-17 DIAGNOSIS — L659 Nonscarring hair loss, unspecified: Secondary | ICD-10-CM | POA: Diagnosis not present

## 2016-04-22 DIAGNOSIS — M25572 Pain in left ankle and joints of left foot: Secondary | ICD-10-CM | POA: Diagnosis not present

## 2016-05-26 DIAGNOSIS — L638 Other alopecia areata: Secondary | ICD-10-CM | POA: Diagnosis not present

## 2017-03-05 ENCOUNTER — Other Ambulatory Visit: Payer: Self-pay | Admitting: Family Medicine

## 2017-03-05 DIAGNOSIS — Z1231 Encounter for screening mammogram for malignant neoplasm of breast: Secondary | ICD-10-CM

## 2017-03-10 DIAGNOSIS — Z131 Encounter for screening for diabetes mellitus: Secondary | ICD-10-CM | POA: Diagnosis not present

## 2017-03-10 DIAGNOSIS — Z0000 Encounter for general adult medical examination without abnormal findings: Secondary | ICD-10-CM | POA: Diagnosis not present

## 2017-03-10 DIAGNOSIS — E7800 Pure hypercholesterolemia, unspecified: Secondary | ICD-10-CM | POA: Diagnosis not present

## 2017-03-10 DIAGNOSIS — E785 Hyperlipidemia, unspecified: Secondary | ICD-10-CM | POA: Diagnosis not present

## 2017-03-26 ENCOUNTER — Other Ambulatory Visit: Payer: Self-pay | Admitting: Family Medicine

## 2017-03-26 ENCOUNTER — Ambulatory Visit
Admission: RE | Admit: 2017-03-26 | Discharge: 2017-03-26 | Disposition: A | Discharge status: Home / Self Care | Payer: 59 | Source: Ambulatory Visit | Attending: Family Medicine | Admitting: Family Medicine

## 2017-03-26 DIAGNOSIS — Z1231 Encounter for screening mammogram for malignant neoplasm of breast: Secondary | ICD-10-CM

## 2017-05-07 DIAGNOSIS — L02214 Cutaneous abscess of groin: Secondary | ICD-10-CM | POA: Diagnosis not present

## 2017-06-23 ENCOUNTER — Other Ambulatory Visit: Payer: Self-pay | Admitting: Family Medicine

## 2017-06-23 ENCOUNTER — Other Ambulatory Visit (HOSPITAL_COMMUNITY)
Admission: RE | Admit: 2017-06-23 | Discharge: 2017-06-23 | Disposition: A | Discharge status: Home / Self Care | Payer: 59 | Source: Ambulatory Visit | Attending: Family Medicine | Admitting: Family Medicine

## 2017-06-23 DIAGNOSIS — Z124 Encounter for screening for malignant neoplasm of cervix: Secondary | ICD-10-CM | POA: Insufficient documentation

## 2017-06-25 LAB — CYTOLOGY - PAP: DIAGNOSIS: NEGATIVE

## 2018-02-22 ENCOUNTER — Other Ambulatory Visit: Payer: Self-pay | Admitting: Family Medicine

## 2018-02-22 DIAGNOSIS — Z1231 Encounter for screening mammogram for malignant neoplasm of breast: Secondary | ICD-10-CM

## 2018-03-04 DIAGNOSIS — Z23 Encounter for immunization: Secondary | ICD-10-CM | POA: Diagnosis not present

## 2018-03-28 ENCOUNTER — Ambulatory Visit
Admission: RE | Admit: 2018-03-28 | Discharge: 2018-03-28 | Disposition: A | Discharge status: Home / Self Care | Payer: 59 | Source: Ambulatory Visit | Attending: Family Medicine | Admitting: Family Medicine

## 2018-03-28 DIAGNOSIS — Z1231 Encounter for screening mammogram for malignant neoplasm of breast: Secondary | ICD-10-CM | POA: Diagnosis not present

## 2019-03-22 ENCOUNTER — Other Ambulatory Visit: Payer: Self-pay | Admitting: Family Medicine

## 2019-03-22 ENCOUNTER — Ambulatory Visit: Payer: Self-pay

## 2019-03-22 ENCOUNTER — Other Ambulatory Visit: Payer: Self-pay

## 2019-03-22 DIAGNOSIS — M79644 Pain in right finger(s): Secondary | ICD-10-CM

## 2019-03-23 ENCOUNTER — Other Ambulatory Visit: Payer: Self-pay | Admitting: Family Medicine

## 2019-03-23 DIAGNOSIS — Z1231 Encounter for screening mammogram for malignant neoplasm of breast: Secondary | ICD-10-CM

## 2019-03-31 ENCOUNTER — Other Ambulatory Visit: Payer: Self-pay

## 2019-03-31 ENCOUNTER — Ambulatory Visit
Admission: RE | Admit: 2019-03-31 | Discharge: 2019-03-31 | Disposition: A | Discharge status: Home / Self Care | Payer: 59 | Source: Ambulatory Visit

## 2019-03-31 DIAGNOSIS — Z1231 Encounter for screening mammogram for malignant neoplasm of breast: Secondary | ICD-10-CM

## 2020-04-03 ENCOUNTER — Other Ambulatory Visit: Payer: Self-pay | Admitting: Family Medicine

## 2020-04-03 DIAGNOSIS — Z1231 Encounter for screening mammogram for malignant neoplasm of breast: Secondary | ICD-10-CM

## 2020-04-23 IMAGING — MG DIGITAL SCREENING BILAT W/ TOMO W/ CAD
8 series · 8 of 24 positions shown · non-contrast
Comparison: Previous exam(s).

CLINICAL DATA: Screening.

EXAM:
DIGITAL SCREENING BILATERAL MAMMOGRAM WITH TOMO AND CAD

[R MLO synth-2D]
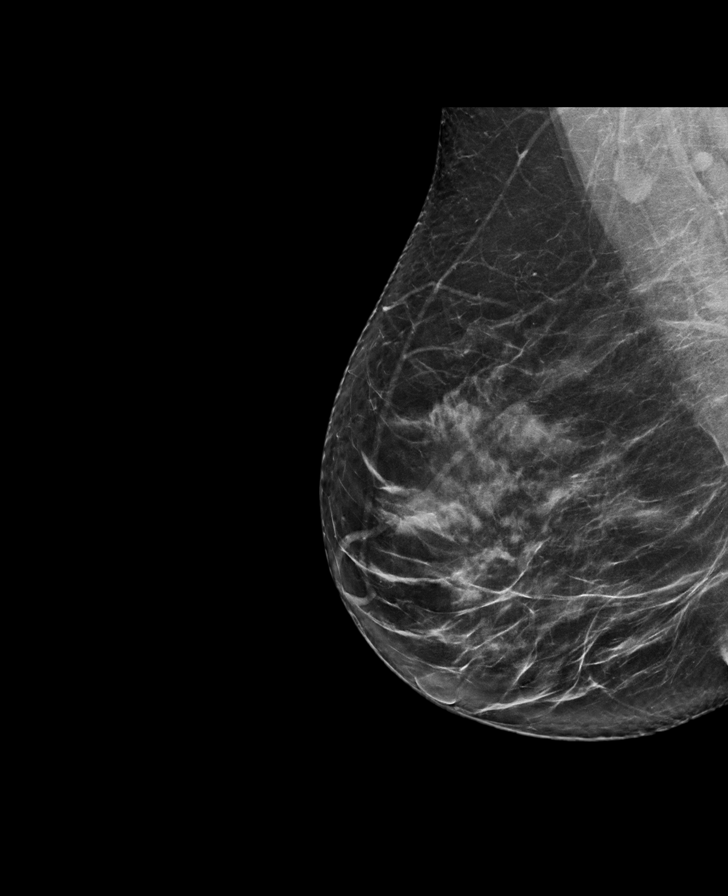

[L CC synth-2D]
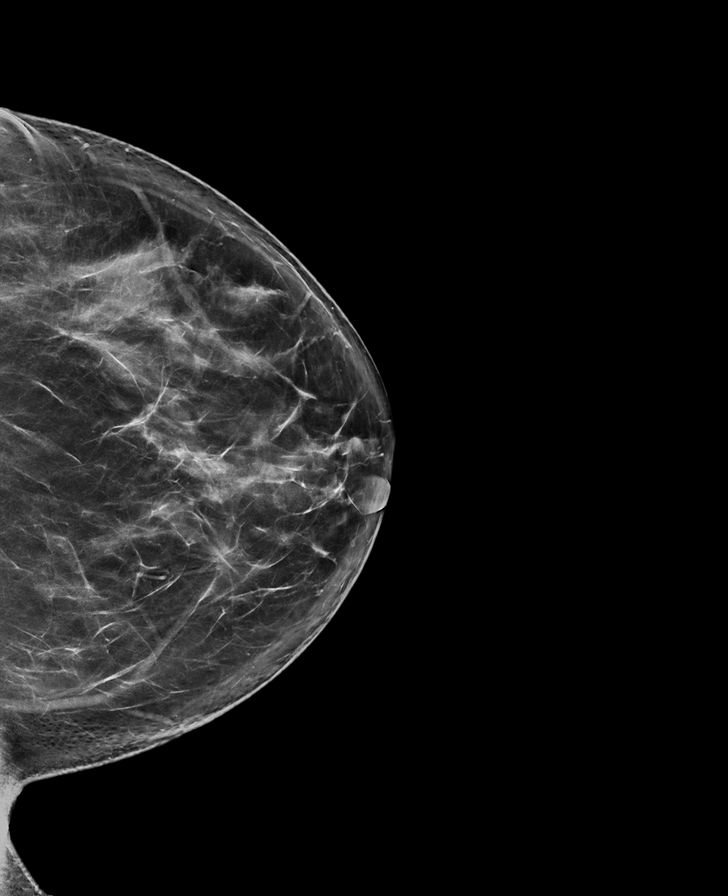

[L MLO synth-2D]
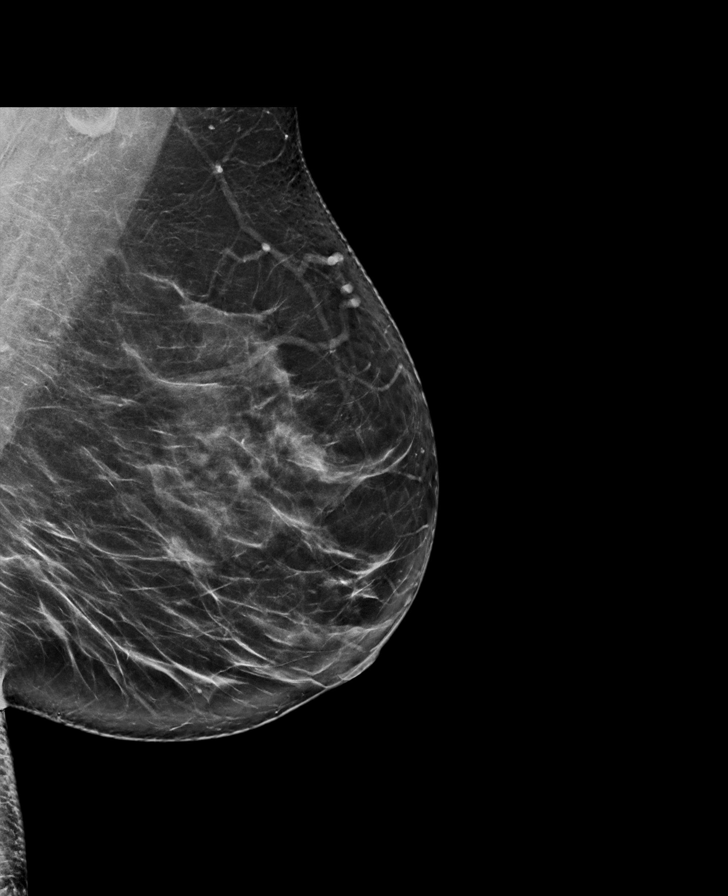

[R CC synth-2D]
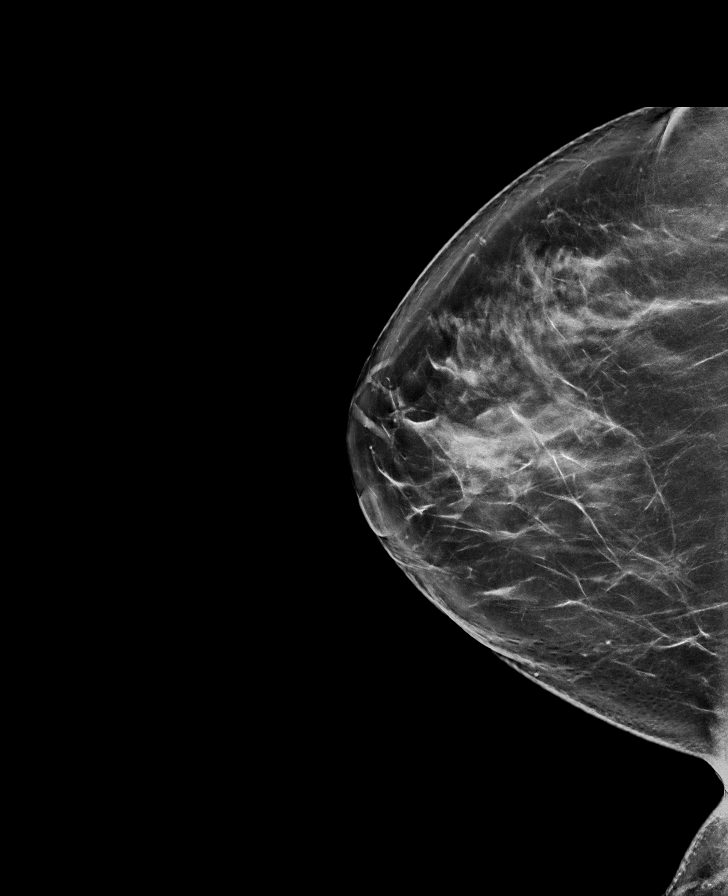

[R CC tomo · tomo slice 44/87.0]
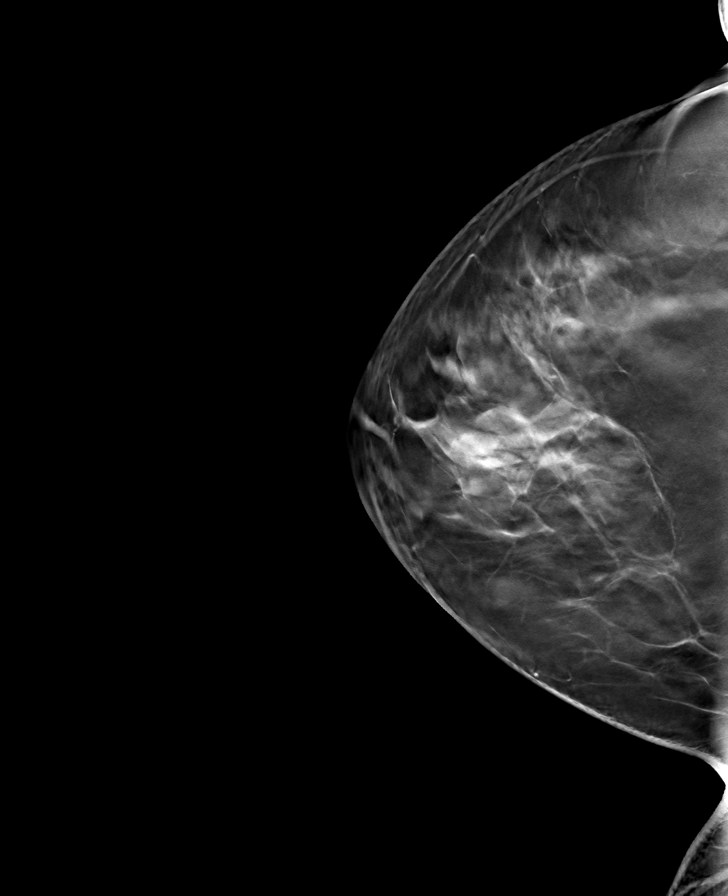

[L MLO tomo · tomo slice 43/86.0]
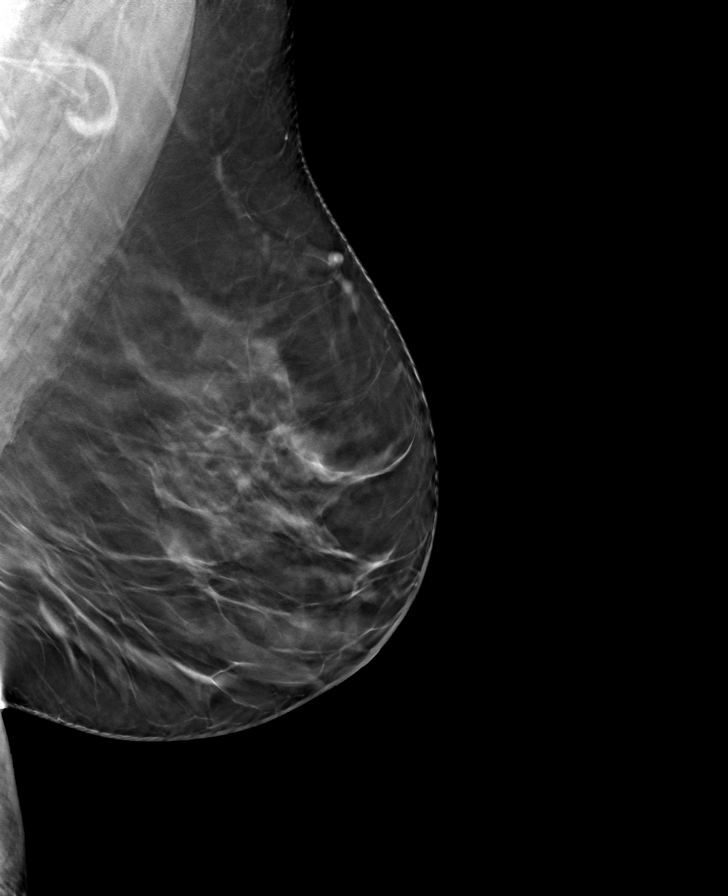

[R MLO tomo · tomo slice 41/81.0]
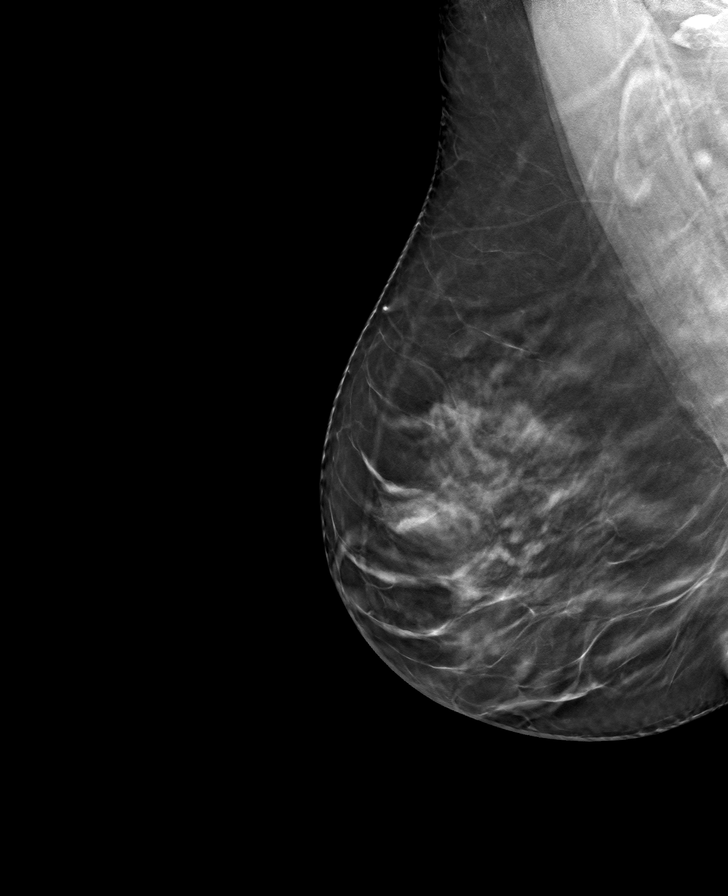

[L CC tomo · tomo slice 43/84.0]
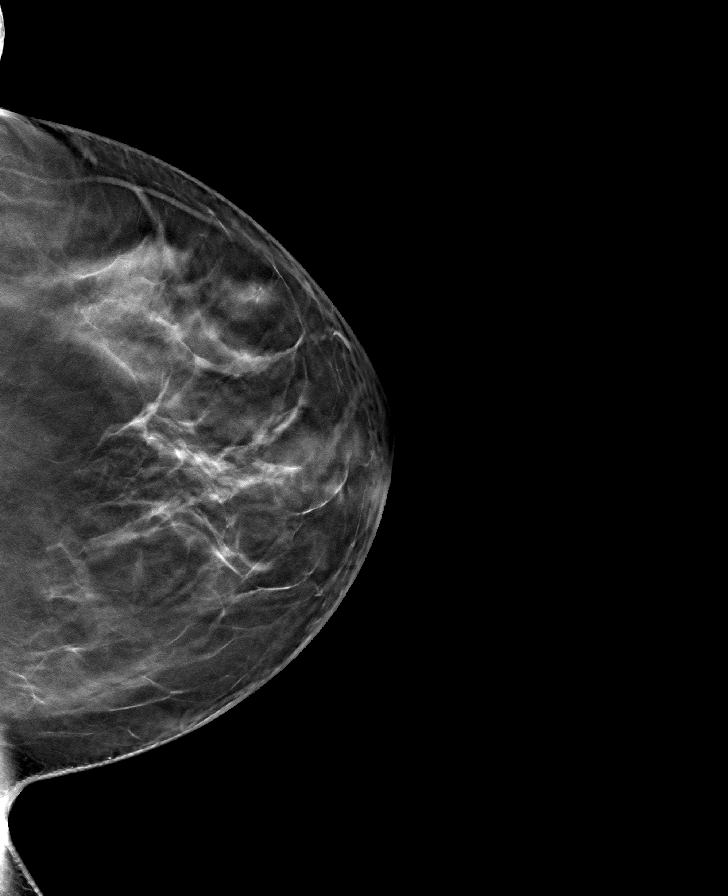

[8 of 24 positions shown; findings below may reference images not displayed]

ACR Breast Density Category c: The breast tissue is heterogeneously
dense, which may obscure small masses.
FINDINGS: There are no findings suspicious for malignancy. Images were
processed with CAD.
IMPRESSION: No mammographic evidence of malignancy. A result letter of this
screening mammogram will be mailed directly to the patient.

RECOMMENDATION:
Screening mammogram in one year. (Code:[id number]-U-LHB)

BI-RADS CATEGORY  1: Negative.

## 2020-04-30 DIAGNOSIS — Z1231 Encounter for screening mammogram for malignant neoplasm of breast: Secondary | ICD-10-CM

## 2022-02-03 ENCOUNTER — Encounter (HOSPITAL_BASED_OUTPATIENT_CLINIC_OR_DEPARTMENT_OTHER): Payer: Self-pay | Admitting: Obstetrics & Gynecology

## 2022-02-03 ENCOUNTER — Other Ambulatory Visit (HOSPITAL_BASED_OUTPATIENT_CLINIC_OR_DEPARTMENT_OTHER): Payer: Self-pay

## 2022-02-03 ENCOUNTER — Other Ambulatory Visit (HOSPITAL_COMMUNITY)
Admission: RE | Admit: 2022-02-03 | Discharge: 2022-02-03 | Disposition: A | Discharge status: Home / Self Care | Payer: 59 | Source: Ambulatory Visit | Attending: Obstetrics & Gynecology | Admitting: Obstetrics & Gynecology

## 2022-02-03 ENCOUNTER — Ambulatory Visit (HOSPITAL_BASED_OUTPATIENT_CLINIC_OR_DEPARTMENT_OTHER): Payer: 59 | Admitting: Obstetrics & Gynecology

## 2022-02-03 ENCOUNTER — Other Ambulatory Visit: Payer: Self-pay | Admitting: Family Medicine

## 2022-02-03 VITALS — BP 128/69 | HR 66 | Ht 65.5 in | Wt 195.2 lb

## 2022-02-03 DIAGNOSIS — Z1231 Encounter for screening mammogram for malignant neoplasm of breast: Secondary | ICD-10-CM

## 2022-02-03 DIAGNOSIS — Z1211 Encounter for screening for malignant neoplasm of colon: Secondary | ICD-10-CM

## 2022-02-03 DIAGNOSIS — L732 Hidradenitis suppurativa: Secondary | ICD-10-CM

## 2022-02-03 DIAGNOSIS — Z800 Family history of malignant neoplasm of digestive organs: Secondary | ICD-10-CM

## 2022-02-03 DIAGNOSIS — Z30011 Encounter for initial prescription of contraceptive pills: Secondary | ICD-10-CM | POA: Diagnosis not present

## 2022-02-03 DIAGNOSIS — Z124 Encounter for screening for malignant neoplasm of cervix: Secondary | ICD-10-CM | POA: Insufficient documentation

## 2022-02-03 DIAGNOSIS — Z01419 Encounter for gynecological examination (general) (routine) without abnormal findings: Secondary | ICD-10-CM | POA: Diagnosis not present

## 2022-02-03 MED ORDER — NORETHINDRONE 0.35 MG PO TABS: 1.0000 | ORAL_TABLET | Freq: Every day | ORAL | 3 refills | Status: DC

## 2022-02-03 MED ORDER — CLINDAMYCIN PALMITATE HCL 75 MG/5ML PO SOLR: ORAL | 1 refills | Status: DC

## 2022-02-03 NOTE — Progress Notes (Unsigned)
46 y.o. G0P0000 Married White or Caucasian female here for new patient/annual exam.  Cycles have been regular in the past but she has started to skip some cycles.  She does report cycles are heavier on the first day of each cycles.  She does have some fatigue and lightheaded on this day of heavier bleeding.  The heavier bleeding stops after 24 hours.  Then bleeding lessens.  Did have blood work in October.  I do not have copies but she reports there was no anemia and she did have normal thyroid testing.  Treatment options discussed.  She is also interested in something for contraception.  Has been on combination OCPs in the past.  Had severe headaches and some visual changes.  This all resolved when it was stopped.    Has some concerns about possible hidradenitis.  Has never been diagnosed.  Has experienced axillary issues.  Using antibacterial Dove body wash has helped.  Has done research on her own and does have appt with Dr. Lonna Cobb in February.    Patient's last menstrual period was 01/23/2022.          Sexually active: Yes.    The current method of family planning is condoms always.     Upstream - 02/03/22 1512       Pregnancy Intention Screening   Does the patient want to become pregnant in the next year? No    Does the patient's partner want to become pregnant in the next year? No    Would the patient like to discuss contraceptive options today? Yes      Contraception Wrap Up   Current Method Female Condom            Smoker:  no  Health Maintenance: Pap:  2019 History of abnormal Pap:  no MMG:  04/03/2019 Colonoscopy:  has not done but has family history so referral will be placed Screening Labs: done 11/2021   reports that she has never smoked. She has never used smokeless tobacco. She reports that she does not drink alcohol and does not use drugs.  Past Medical History:  Diagnosis Date   Anxiety    HSV-1 infection     Past Surgical History:  Procedure  Laterality Date   WISDOM TOOTH EXTRACTION Bilateral     Current Outpatient Medications  Medication Sig Dispense Refill   docusate sodium (COLACE) 100 MG capsule Take 1 capsule (100 mg total) by mouth 2 (two) times daily. 10 capsule 0   Magnesium 250 MG TABS Take by mouth.     No current facility-administered medications for this visit.    Family History  Problem Relation Age of Onset   Cancer Paternal Grandfather        brain   Alzheimer's disease Paternal Grandmother    Diabetes Maternal Grandfather    Colon cancer Father    Arthritis Mother    Anxiety disorder Sister    Depression Sister     ROS: Constitutional: negative Genitourinary: superficial skin infections  Exam:   BP 128/69   Pulse 66   Ht 5' 5.5" (1.664 m)   Wt 195 lb 3.2 oz (88.5 kg)   LMP 01/23/2022   BMI 31.99 kg/m   Height: 5' 5.5" (166.4 cm)  General appearance: alert, cooperative and appears stated age Head: Normocephalic, without obvious abnormality, atraumatic Neck: no adenopathy, supple, symmetrical, trachea midline and thyroid normal to inspection and palpation Lungs: clear to auscultation bilaterally Breasts: normal appearance, no masses or tenderness Heart: regular  rate and rhythm Abdomen: soft, non-tender; bowel sounds normal; no masses,  no organomegaly Extremities: extremities normal, atraumatic, no cyanosis or edema Skin: Skin color, texture, turgor normal. No rashes or lesions Lymph nodes: Cervical, supraclavicular, and axillary nodes normal. No abnormal inguinal nodes palpated Neurologic: Grossly normal   Pelvic: External genitalia:  several areas of superficial infection and some scarring present primarily on labia              Urethra:  normal appearing urethra with no masses, tenderness or lesions              Bartholins and Skenes: normal                 Vagina: normal appearing vagina with normal color and no discharge, no lesions              Cervix: no lesions               Pap taken: Yes.   Bimanual Exam:  Uterus:  normal size, contour, position, consistency, mobility, non-tender              Adnexa: normal adnexa and no mass, fullness, tenderness               Rectovaginal: Confirms               Anus:  normal sphincter tone, no lesions  Chaperone, Ezekiel Ina, RN, was present for exam.  Assessment/Plan: 1. Well woman exam with routine gynecological exam - Pap smear and HR HPV obtained today - Mammogram scheduled tomorrow - Colonoscopy referral will be made - lab work done 11/2021 - vaccines reviewed/updated  2. Cervical cancer screening - Cytology - PAP( [facility])  3. Encounter for initial prescription of contraceptive pills - norethindrone (MICRONOR) 0.35 MG tablet; Take 1 tablet (0.35 mg total) by mouth daily.  Dispense: 28 tablet; Refill: 3  4. Colon cancer screening - Ambulatory referral to Gastroenterology  5. Family history of colon cancer  6. Hidradenitis suppurativa - clindamycin (CLEOCIN T) 1 % external solution; Apply topically 2 (two) times daily.  Dispense: 60 mL; Refill: 2 - wound culture obtained.  Addendum: due to processing issue, culture could not be run.  Pt contacted and did not desire to have repeated due to starting topical antibiotic solution.

## 2022-02-04 ENCOUNTER — Other Ambulatory Visit (HOSPITAL_BASED_OUTPATIENT_CLINIC_OR_DEPARTMENT_OTHER): Payer: Self-pay

## 2022-02-04 ENCOUNTER — Ambulatory Visit
Admission: RE | Admit: 2022-02-04 | Discharge: 2022-02-04 | Disposition: A | Discharge status: Home / Self Care | Payer: 59 | Source: Ambulatory Visit | Attending: Family Medicine | Admitting: Family Medicine

## 2022-02-04 ENCOUNTER — Other Ambulatory Visit (HOSPITAL_BASED_OUTPATIENT_CLINIC_OR_DEPARTMENT_OTHER): Payer: Self-pay | Admitting: Obstetrics & Gynecology

## 2022-02-04 DIAGNOSIS — Z30011 Encounter for initial prescription of contraceptive pills: Secondary | ICD-10-CM | POA: Insufficient documentation

## 2022-02-04 DIAGNOSIS — L732 Hidradenitis suppurativa: Secondary | ICD-10-CM | POA: Insufficient documentation

## 2022-02-04 DIAGNOSIS — Z1231 Encounter for screening mammogram for malignant neoplasm of breast: Secondary | ICD-10-CM

## 2022-02-04 DIAGNOSIS — Z800 Family history of malignant neoplasm of digestive organs: Secondary | ICD-10-CM | POA: Insufficient documentation

## 2022-02-04 LAB — CYTOLOGY - PAP
Comment: NEGATIVE
Diagnosis: NEGATIVE
High risk HPV: NEGATIVE

## 2022-02-04 MED ORDER — CLINDAMYCIN PHOSPHATE 1 % EX SOLN: Freq: Two times a day (BID) | CUTANEOUS | 2 refills | Status: DC

## 2022-02-04 NOTE — Addendum Note (Signed)
Addended by: Megan Salon on: 02/04/2022 05:06 PM   Modules accepted: Orders

## 2022-02-05 ENCOUNTER — Ambulatory Visit (HOSPITAL_BASED_OUTPATIENT_CLINIC_OR_DEPARTMENT_OTHER): Payer: 59 | Admitting: Obstetrics & Gynecology

## 2022-02-12 ENCOUNTER — Encounter (HOSPITAL_BASED_OUTPATIENT_CLINIC_OR_DEPARTMENT_OTHER): Payer: Self-pay | Admitting: Obstetrics & Gynecology

## 2022-03-09 ENCOUNTER — Ambulatory Visit (AMBULATORY_SURGERY_CENTER): Payer: 59

## 2022-03-09 VITALS — Ht 65.0 in | Wt 195.0 lb

## 2022-03-09 DIAGNOSIS — Z1211 Encounter for screening for malignant neoplasm of colon: Secondary | ICD-10-CM

## 2022-03-09 DIAGNOSIS — Z800 Family history of malignant neoplasm of digestive organs: Secondary | ICD-10-CM

## 2022-03-09 MED ORDER — NA SULFATE-K SULFATE-MG SULF 17.5-3.13-1.6 GM/177ML PO SOLN: 1.0000 | Freq: Once | ORAL | 0 refills | Status: AC

## 2022-03-09 NOTE — Progress Notes (Signed)
No egg or soy allergy known to patient  No issues known to pt with past sedation with any surgeries or procedures Patient denies ever being told they had issues or difficulty with intubation  No FH of Malignant Hyperthermia Pt is not on diet pills Pt is not on  home 02  Pt is not on blood thinners  Pt denies issues with constipation  No A fib or A flutter Have any cardiac testing pending--no Pt instructed to use [email or web address] or GoodRx for a price reduction on prep   

## 2022-03-20 ENCOUNTER — Encounter: Payer: Self-pay | Admitting: Gastroenterology

## 2022-03-29 ENCOUNTER — Encounter: Payer: Self-pay | Admitting: Certified Registered Nurse Anesthetist

## 2022-03-30 ENCOUNTER — Encounter: Payer: Self-pay | Admitting: Gastroenterology

## 2022-03-30 ENCOUNTER — Ambulatory Visit (AMBULATORY_SURGERY_CENTER): Payer: 59 | Admitting: Gastroenterology

## 2022-03-30 VITALS — BP 98/64 | HR 63 | Temp 98.6°F | Resp 13 | Ht 65.5 in | Wt 195.8 lb

## 2022-03-30 DIAGNOSIS — Z1211 Encounter for screening for malignant neoplasm of colon: Secondary | ICD-10-CM | POA: Diagnosis present

## 2022-03-30 DIAGNOSIS — Z800 Family history of malignant neoplasm of digestive organs: Secondary | ICD-10-CM

## 2022-03-30 DIAGNOSIS — K635 Polyp of colon: Secondary | ICD-10-CM

## 2022-03-30 DIAGNOSIS — D125 Benign neoplasm of sigmoid colon: Secondary | ICD-10-CM | POA: Diagnosis not present

## 2022-03-30 LAB — POCT URINE PREGNANCY: Preg Test, Ur: NEGATIVE

## 2022-03-30 MED ORDER — SODIUM CHLORIDE 0.9 % IV SOLN: 500.0000 mL | Freq: Once | INTRAVENOUS | Status: DC

## 2022-03-30 NOTE — Progress Notes (Signed)
VS by DT  Pt's states no medical or surgical changes since previsit or office visit.  

## 2022-03-30 NOTE — Progress Notes (Signed)
Report given to PACU, vss 

## 2022-03-30 NOTE — Op Note (Signed)
[place] Patient Name: Emily Norman Procedure Date: 03/30/2022 10:44 AM MRN: [id number] Endoscopist: Thornton Park MD, MD, [id number] Age: 47 Referring MD:  Date of Birth: April 11, 1975 Gender: Female Account #: [id number] Procedure:                Colonoscopy Indications:              Screening for colorectal malignant neoplasm, This                            is the patient's first colonoscopy                           Father with colon cancer at age 47 Medicines:                Monitored Anesthesia Care Procedure:                Pre-Anesthesia Assessment:                           - Prior to the procedure, a History and Physical                            was performed, and patient medications and                            allergies were reviewed. The patient's tolerance of                            previous anesthesia was also reviewed. The risks                            and benefits of the procedure and the sedation                            options and risks were discussed with the patient.                            All questions were answered, and informed consent                            was obtained. Prior Anticoagulants: The patient has                            taken no anticoagulant or antiplatelet agents. ASA                            Grade Assessment: II - A patient with mild systemic                            disease. After reviewing the risks and benefits,                            the patient was deemed in satisfactory condition to  undergo the procedure.                           After obtaining informed consent, the colonoscope                            was passed under direct vision. Throughout the                            procedure, the patient's blood pressure, pulse, and                            oxygen saturations were monitored continuously. The                            CF [id number] was introduced  through the anus                            and advanced to the 3 cm into the ileum. A second                            forward view of the right colon was performed. The                            colonoscopy was performed without difficulty. The                            patient tolerated the procedure well. The quality                            of the bowel preparation was good. The terminal                            ileum, ileocecal valve, appendiceal orifice, and                            rectum were photographed. Scope In: 10:53:13 AM Scope Out: 11:07:08 AM Scope Withdrawal Time: 0 hours 10 minutes 39 seconds  Total Procedure Duration: 0 hours 13 minutes 55 seconds  Findings:                 The perianal and digital rectal examinations were                            normal.                           Non-bleeding internal hemorrhoids were found.                           A 2 mm polyp was found in the sigmoid colon. The                            polyp was sessile. The polyp was removed with a  cold snare. Resection and retrieval were complete.                            Estimated blood loss was minimal.                           The exam was otherwise without abnormality on                            direct and retroflexion views. Complications:            No immediate complications. Estimated Blood Loss:     Estimated blood loss was minimal. Impression:               - Non-bleeding internal hemorrhoids.                           - One 2 mm polyp in the sigmoid colon, removed with                            a cold snare. Resected and retrieved.                           - The examination was otherwise normal on direct                            and retroflexion views. Recommendation:           - Patient has a contact number available for                            emergencies. The signs and symptoms of potential                            delayed  complications were discussed with the                            patient. Return to normal activities tomorrow.                            Written discharge instructions were provided to the                            patient.                           - Resume previous diet.                           - Continue present medications.                           - Await pathology results.                           - Repeat colonoscopy in 5 years for surveillance  regardless of pathology results given the family                            history.                           - Emerging evidence supports eating a diet of                            fruits, vegetables, grains, calcium, and yogurt                            while reducing red meat and alcohol may reduce the                            risk of colon cancer.                           - Thank you for allowing me to be involved in your                            colon cancer prevention. Thornton Park MD, MD 03/30/2022 11:11:11 AM This report has been signed electronically.

## 2022-03-30 NOTE — Patient Instructions (Signed)
Discharge instructions given. Handouts on polyps and Hemorrhoids. Resume previous medications. YOU HAD AN ENDOSCOPIC PROCEDURE TODAY AT [facility]:   Refer to the procedure report that was given to you for any specific questions about what was found during the examination.  If the procedure report does not answer your questions, please call your gastroenterologist to clarify.  If you requested that your care partner not be given the details of your procedure findings, then the procedure report has been included in a sealed envelope for you to review at your convenience later.  YOU SHOULD EXPECT: Some feelings of bloating in the abdomen. Passage of more gas than usual.  Walking can help get rid of the air that was put into your GI tract during the procedure and reduce the bloating. If you had a lower endoscopy (such as a colonoscopy or flexible sigmoidoscopy) you may notice spotting of blood in your stool or on the toilet paper. If you underwent a bowel prep for your procedure, you may not have a normal bowel movement for a few days.  Please Note:  You might notice some irritation and congestion in your nose or some drainage.  This is from the oxygen used during your procedure.  There is no need for concern and it should clear up in a day or so.  SYMPTOMS TO REPORT IMMEDIATELY:  Following lower endoscopy (colonoscopy or flexible sigmoidoscopy):  Excessive amounts of blood in the stool  Significant tenderness or worsening of abdominal pains  Swelling of the abdomen that is new, acute  Fever of 100F or higher   For urgent or emergent issues, a gastroenterologist can be reached at any hour by calling [phone]. Do not use MyChart messaging for urgent concerns.    DIET:  We do recommend a small meal at first, but then you may proceed to your regular diet.  Drink plenty of fluids but you should avoid alcoholic beverages for 24 hours.  ACTIVITY:  You should plan to take it  easy for the rest of today and you should NOT DRIVE or use heavy machinery until tomorrow (because of the sedation medicines used during the test).    FOLLOW UP: Our staff will call the number listed on your records the next business day following your procedure.  We will call around 7:15- 8:00 am to check on you and address any questions or concerns that you may have regarding the information given to you following your procedure. If we do not reach you, we will leave a message.     If any biopsies were taken you will be contacted by phone or by letter within the next 1-3 weeks.  Please call us at [phone] if you have not heard about the biopsies in 3 weeks.    SIGNATURES/CONFIDENTIALITY: You and/or your care partner have signed paperwork which will be entered into your electronic medical record.  These signatures attest to the fact that that the information above on your After Visit Summary has been reviewed and is understood.  Full responsibility of the confidentiality of this discharge information lies with you and/or your care-partner.

## 2022-03-30 NOTE — Progress Notes (Signed)
Called to room to assist during endoscopic procedure.  Patient ID and intended procedure confirmed with present staff. Received instructions for my participation in the procedure from the performing physician.  

## 2022-03-30 NOTE — Progress Notes (Signed)
   Referring Provider: Cari Caraway, MD Primary Care Physician:  Megan Salon, MD  Indication for Colonoscopy:  Colon cancer screening   IMPRESSION:  Need for colon cancer screening Appropriate candidate for monitored anesthesia care  PLAN: Colonoscopy in the [place] today   HPI: Emily Norman is a 47 y.o. female presents for screening colonoscopy.  No prior colonoscopy or colon cancer screening.  Father with colon cancer at age 38. No other known family history of colon cancer or polyps. No family history of uterine/endometrial cancer, pancreatic cancer or gastric/stomach cancer.   Past Medical History:  Diagnosis Date   Anxiety    HSV-1 infection    Migraine with aura, with intractable migraine, so stated, with status migrainosus    occurred only while on estrogen containing OCP.  Stopped once off.    Past Surgical History:  Procedure Laterality Date   WISDOM TOOTH EXTRACTION Bilateral     Current Outpatient Medications  Medication Sig Dispense Refill   clindamycin (CLEOCIN T) 1 % external solution Apply topically 2 (two) times daily. 60 mL 2   clindamycin (CLEOCIN) 75 MG/5ML solution Apply topically twice daily 100 mL 1   docusate sodium (COLACE) 100 MG capsule Take 1 capsule (100 mg total) by mouth 2 (two) times daily. 10 capsule 0   Magnesium 250 MG TABS Take by mouth.     Multiple Vitamin (MULTIVITAMIN) capsule as directed Orally     norethindrone (MICRONOR) 0.35 MG tablet Take 1 tablet (0.35 mg total) by mouth daily. 28 tablet 3   No current facility-administered medications for this visit.    Allergies as of 03/30/2022 - Review Complete 03/29/2022  Allergen Reaction Noted   Amoxil [amoxicillin] Other (See Comments) 04/05/2014    Family History  Problem Relation Age of Onset   Arthritis Mother    Colon cancer Father 49   Anxiety disorder Sister    Depression Sister    Diabetes Maternal Grandfather    Alzheimer's disease Paternal Grandmother     Cancer Paternal Grandfather        brain   Colon polyps Neg Hx    Esophageal cancer Neg Hx    Rectal cancer Neg Hx    Stomach cancer Neg Hx      Physical Exam: General:   Alert,  well-nourished, pleasant and cooperative in NAD Head:  Normocephalic and atraumatic. Eyes:  Sclera clear, no icterus.   Conjunctiva pink. Mouth:  No deformity or lesions.   Neck:  Supple; no masses or thyromegaly. Lungs:  Clear throughout to auscultation.   No wheezes. Heart:  Regular rate and rhythm; no murmurs. Abdomen:  Soft, non-tender, nondistended, normal bowel sounds, no rebound or guarding.  Msk:  Symmetrical. No boney deformities LAD: No inguinal or umbilical LAD Extremities:  No clubbing or edema. Neurologic:  Alert and  oriented x4;  grossly nonfocal Skin:  No obvious rash or bruise. Psych:  Alert and cooperative. Normal mood and affect.     Studies/Results: No results found.    Cayla Wiegand L. Tarri Glenn, MD, MPH 03/30/2022, 10:11 AM

## 2022-03-31 ENCOUNTER — Telehealth: Payer: Self-pay | Admitting: *Deleted

## 2022-03-31 NOTE — Telephone Encounter (Signed)
No answer on  follow up call. Left message.   

## 2022-04-03 ENCOUNTER — Encounter: Payer: Self-pay | Admitting: Gastroenterology

## 2022-04-09 ENCOUNTER — Encounter (HOSPITAL_BASED_OUTPATIENT_CLINIC_OR_DEPARTMENT_OTHER): Payer: Self-pay | Admitting: Obstetrics & Gynecology

## 2022-04-27 ENCOUNTER — Other Ambulatory Visit (HOSPITAL_BASED_OUTPATIENT_CLINIC_OR_DEPARTMENT_OTHER): Payer: Self-pay | Admitting: Obstetrics & Gynecology

## 2022-04-27 DIAGNOSIS — Z30011 Encounter for initial prescription of contraceptive pills: Secondary | ICD-10-CM

## 2022-05-03 ENCOUNTER — Other Ambulatory Visit (HOSPITAL_BASED_OUTPATIENT_CLINIC_OR_DEPARTMENT_OTHER): Payer: Self-pay | Admitting: Obstetrics & Gynecology

## 2022-05-03 DIAGNOSIS — L732 Hidradenitis suppurativa: Secondary | ICD-10-CM

## 2022-05-29 ENCOUNTER — Other Ambulatory Visit (HOSPITAL_BASED_OUTPATIENT_CLINIC_OR_DEPARTMENT_OTHER): Payer: Self-pay | Admitting: Obstetrics & Gynecology

## 2022-05-29 DIAGNOSIS — L732 Hidradenitis suppurativa: Secondary | ICD-10-CM

## 2022-05-29 DIAGNOSIS — L0591 Pilonidal cyst without abscess: Secondary | ICD-10-CM | POA: Insufficient documentation

## 2022-05-29 MED ORDER — CLINDAMYCIN PHOSPHATE 1 % EX SOLN: Freq: Two times a day (BID) | CUTANEOUS | 5 refills | Status: DC

## 2022-10-11 ENCOUNTER — Encounter (HOSPITAL_BASED_OUTPATIENT_CLINIC_OR_DEPARTMENT_OTHER): Payer: Self-pay | Admitting: Obstetrics & Gynecology

## 2022-10-11 ENCOUNTER — Other Ambulatory Visit (HOSPITAL_BASED_OUTPATIENT_CLINIC_OR_DEPARTMENT_OTHER): Payer: Self-pay | Admitting: Obstetrics & Gynecology

## 2022-10-11 DIAGNOSIS — Z30011 Encounter for initial prescription of contraceptive pills: Secondary | ICD-10-CM

## 2022-10-12 ENCOUNTER — Other Ambulatory Visit: Payer: Self-pay | Admitting: Obstetrics & Gynecology

## 2022-10-12 DIAGNOSIS — Z1231 Encounter for screening mammogram for malignant neoplasm of breast: Secondary | ICD-10-CM

## 2023-01-01 ENCOUNTER — Other Ambulatory Visit (HOSPITAL_BASED_OUTPATIENT_CLINIC_OR_DEPARTMENT_OTHER): Payer: Self-pay | Admitting: Obstetrics & Gynecology

## 2023-01-01 DIAGNOSIS — Z30011 Encounter for initial prescription of contraceptive pills: Secondary | ICD-10-CM

## 2023-02-08 ENCOUNTER — Inpatient Hospital Stay
Admission: RE | Admit: 2023-02-08 | Discharge: 2023-02-08 | Discharge status: Home / Self Care | Payer: 59 | Source: Ambulatory Visit | Attending: Obstetrics & Gynecology | Admitting: Obstetrics & Gynecology

## 2023-02-08 DIAGNOSIS — Z1231 Encounter for screening mammogram for malignant neoplasm of breast: Secondary | ICD-10-CM

## 2023-02-15 ENCOUNTER — Other Ambulatory Visit: Payer: Self-pay | Admitting: Obstetrics & Gynecology

## 2023-02-15 DIAGNOSIS — R928 Other abnormal and inconclusive findings on diagnostic imaging of breast: Secondary | ICD-10-CM

## 2023-03-03 ENCOUNTER — Ambulatory Visit
Admission: RE | Admit: 2023-03-03 | Discharge: 2023-03-03 | Disposition: A | Discharge status: Home / Self Care | Payer: 59 | Source: Ambulatory Visit | Attending: Obstetrics & Gynecology | Admitting: Obstetrics & Gynecology

## 2023-03-03 DIAGNOSIS — R928 Other abnormal and inconclusive findings on diagnostic imaging of breast: Secondary | ICD-10-CM

## 2023-03-11 ENCOUNTER — Encounter (HOSPITAL_BASED_OUTPATIENT_CLINIC_OR_DEPARTMENT_OTHER): Payer: Self-pay | Admitting: Obstetrics & Gynecology

## 2023-03-11 ENCOUNTER — Ambulatory Visit (HOSPITAL_BASED_OUTPATIENT_CLINIC_OR_DEPARTMENT_OTHER): Payer: 59 | Admitting: Obstetrics & Gynecology

## 2023-03-11 VITALS — BP 102/64 | HR 74 | Ht 66.0 in | Wt 203.6 lb

## 2023-03-11 DIAGNOSIS — Z800 Family history of malignant neoplasm of digestive organs: Secondary | ICD-10-CM | POA: Diagnosis not present

## 2023-03-11 DIAGNOSIS — Z30011 Encounter for initial prescription of contraceptive pills: Secondary | ICD-10-CM

## 2023-03-11 DIAGNOSIS — L732 Hidradenitis suppurativa: Secondary | ICD-10-CM

## 2023-03-11 DIAGNOSIS — Z01419 Encounter for gynecological examination (general) (routine) without abnormal findings: Secondary | ICD-10-CM | POA: Diagnosis not present

## 2023-03-11 MED ORDER — NORETHINDRONE 0.35 MG PO TABS: 1.0000 | ORAL_TABLET | Freq: Every day | ORAL | 4 refills | Status: AC

## 2023-03-11 NOTE — Progress Notes (Signed)
48 y.o. G0P0000 Married White or Caucasian female here for annual exam.  Doing well.  Has seen Dr. Evelina Dun.  She was on doxycycline.  Every time the dosage the dosage decreased, she would have a flare.  She is now on Humira.   She is on micrnor with minimal bleeding/spotting.    Having some issues   Patient's last menstrual period was 03/04/2023.           Health Maintenance: Pap:  02/03/2022 Negative History of abnormal Pap:  no MMG:  03/03/2023 Recommended 1 year follow up Colonoscopy:  03/30/2022, follow up 5 years Screening Labs: done with PCP   reports that she has never smoked. She has never used smokeless tobacco. She reports that she does not drink alcohol and does not use drugs.  Past Medical History:  Diagnosis Date   Anxiety    HSV-1 infection    Migraine with aura, with intractable migraine, so stated, with status migrainosus    occurred only while on estrogen containing OCP.  Stopped once off.    Past Surgical History:  Procedure Laterality Date   WISDOM TOOTH EXTRACTION Bilateral     Current Outpatient Medications  Medication Sig Dispense Refill   adalimumab (HUMIRA, 2 PEN,) 40 MG/0.4ML pen Inject into the skin.     valACYclovir (VALTREX) 1000 MG tablet Take 1,000 mg by mouth 2 (two) times daily. As needed for flares     Multiple Vitamin (MULTIVITAMIN) capsule as directed Orally     norethindrone (MICRONOR) 0.35 MG tablet Take 1 tablet (0.35 mg total) by mouth daily. 84 tablet 4   No current facility-administered medications for this visit.    Family History  Problem Relation Age of Onset   Arthritis Mother    Colon cancer Father 11   Anxiety disorder Sister    Depression Sister    Diabetes Maternal Grandfather    Alzheimer's disease Paternal Grandmother    Cancer Paternal Grandfather        brain   Colon polyps Neg Hx    Esophageal cancer Neg Hx    Rectal cancer Neg Hx    Stomach cancer Neg Hx     ROS: Constitutional:  negative Genitourinary:negative  Exam:   BP 102/64 (BP Location: Left Arm, Patient Position: Sitting, Cuff Size: Large)   Pulse 74   Ht 5\' 6"  (1.676 m)   Wt 203 lb 9.6 oz (92.4 kg)   LMP 03/04/2023   BMI 32.86 kg/m   Height: 5\' 6"  (167.6 cm)  General appearance: alert, cooperative and appears stated age Head: Normocephalic, without obvious abnormality, atraumatic Neck: no adenopathy, supple, symmetrical, trachea midline and thyroid normal to inspection and palpation Lungs: clear to auscultation bilaterally Breasts: normal appearance, no masses or tenderness Heart: regular rate and rhythm Abdomen: soft, non-tender; bowel sounds normal; no masses,  no organomegaly Extremities: extremities normal, atraumatic, no cyanosis or edema Skin: Skin color, texture, turgor normal. No rashes or lesions Lymph nodes: Cervical, supraclavicular, and axillary nodes normal. No abnormal inguinal nodes palpated Neurologic: Grossly normal   Pelvic: External genitalia:  no lesions              Urethra:  normal appearing urethra with no masses, tenderness or lesions              Bartholins and Skenes: normal                 Vagina: normal appearing vagina with normal color and no discharge, no lesions  Cervix: no lesions              Pap taken: No. Bimanual Exam:  Uterus:  normal size, contour, position, consistency, mobility, non-tender              Adnexa: normal adnexa and no mass, fullness, tenderness               Rectovaginal: Confirms               Anus:  normal sphincter tone, no lesions  Chaperone, Ina Homes, CMA, was present for exam.  Assessment/Plan: 1. Well woman exam with routine gynecological exam (Primary) - Pap smear 01/2022 with neg HR HPV - Mammogram 02/12/2023 with negative - Colonoscopy 03/2022 - lab work done with PCP, Dr. Melba Coon - vaccines reviewed/updated   2. Encounter for initial prescription of contraceptive pills - norethindrone (MICRONOR) 0.35 MG  tablet; Take 1 tablet (0.35 mg total) by mouth daily.  Dispense: 84 tablet; Refill: 4  3. Hidradenitis suppurativa - followed by Dr. Evelina Dun  4. Family history of colon cancer - 03/2022.  Follow up 5 years.

## 2023-11-01 ENCOUNTER — Emergency Department (HOSPITAL_COMMUNITY)
Admission: EM | Admit: 2023-11-01 | Discharge: 2023-11-01 | Disposition: A | Discharge status: Home / Self Care | Source: Ambulatory Visit | Attending: Emergency Medicine | Admitting: Emergency Medicine

## 2023-11-01 ENCOUNTER — Encounter (HOSPITAL_COMMUNITY): Payer: Self-pay

## 2023-11-01 ENCOUNTER — Emergency Department (HOSPITAL_COMMUNITY)

## 2023-11-01 ENCOUNTER — Other Ambulatory Visit: Payer: Self-pay

## 2023-11-01 DIAGNOSIS — R202 Paresthesia of skin: Secondary | ICD-10-CM | POA: Diagnosis present

## 2023-11-01 LAB — CBC
HCT: 42.7 % (ref 36.0–46.0)
Hemoglobin: 13.8 g/dL (ref 12.0–15.0)
MCH: 30.6 pg (ref 26.0–34.0)
MCHC: 32.3 g/dL (ref 30.0–36.0)
MCV: 94.7 fL (ref 80.0–100.0)
Platelets: 370 K/uL (ref 150–400)
RBC: 4.51 MIL/uL (ref 3.87–5.11)
RDW: 12.8 % (ref 11.5–15.5)
WBC: 8.7 K/uL (ref 4.0–10.5)
nRBC: 0 % (ref 0.0–0.2)

## 2023-11-01 LAB — COMPREHENSIVE METABOLIC PANEL WITH GFR
ALT: 58 U/L — ABNORMAL HIGH (ref 0–44)
AST: 38 U/L (ref 15–41)
Albumin: 4.5 g/dL (ref 3.5–5.0)
Alkaline Phosphatase: 67 U/L (ref 38–126)
Anion gap: 12 (ref 5–15)
BUN: 11 mg/dL (ref 6–20)
CO2: 20 mmol/L — ABNORMAL LOW (ref 22–32)
Calcium: 9.5 mg/dL (ref 8.9–10.3)
Chloride: 103 mmol/L (ref 98–111)
Creatinine, Ser: 0.57 mg/dL (ref 0.44–1.00)
GFR, Estimated: >60 mL/min (ref >60)
Glucose, Bld: 96 mg/dL (ref 70–99)
Potassium: 3.5 mmol/L (ref 3.5–5.1)
Sodium: 135 mmol/L (ref 135–145)
Total Bilirubin: 0.3 mg/dL (ref 0.0–1.2)
Total Protein: 7.6 g/dL (ref 6.5–8.1)

## 2023-11-01 NOTE — Discharge Instructions (Signed)
 The test today in the ED were reassuring.  Follow-up with your doctor as we discussed to determine whether or not to continue your medication.

## 2023-11-01 NOTE — ED Triage Notes (Signed)
 Pt presents to ED from home C/O medication reaction. Pt reports tingling to bilateral hands and feet right after taking new medication. States infectious disease started her on new medication for latent TB.

## 2023-11-01 NOTE — ED Provider Triage Note (Cosign Needed Addendum)
 Emergency Medicine Provider Triage Evaluation Note  Emily Norman , a 48 y.o. female  was evaluated in triage.  Pt complains of medication side effect. Symptoms started at 720am. She reports she took isoniazid side and then 20 minutes later started having some dizziness, lightheadedness.  Also notes a tingling sensation on both of her pinkies, both feet and on the left side of her face.  Review of Systems  Positive: tingling Negative: Rash, Chest pain, shortness of breath  Physical Exam  BP (!) 140/72 (BP Location: Left Arm)   Pulse 92   Temp 98.4 F (36.9 C) (Oral)   Resp 16   Ht 5' 6 (1.676 m)   Wt 96.2 kg   SpO2 99%   BMI 34.22 kg/m  Gen:   Awake, no distress   Resp:  Normal effort  MSK:   Moves extremities without difficulty  Other:    Medical Decision Making  Medically screening exam initiated at 4:31 PM.  Appropriate orders placed.  Trinaty Bundrick was informed that the remainder of the evaluation will be completed by another provider, this initial triage assessment does not replace that evaluation, and the importance of remaining in the ED until their evaluation is complete.     Shermon Warren SAILOR, PA-C 11/01/23 1632    Shermon Warren SAILOR, PA-C 11/01/23 1650

## 2023-11-02 NOTE — ED Provider Notes (Signed)
 [place] EMERGENCY DEPARTMENT AT [facility] Provider Note   CSN: [id number] Arrival date & time: 11/01/23  1555     Patient presents with: Medication Reaction   Emily Norman is a 48 y.o. female.   HPI   Pt presented to the ED after an episode of tingling, numbness initially on left side but eventually involved both hands and feet.  Sx started after taking isoniazid.  NO headache.  No speech trouble.  No trouble with balance or coordination.  Pt called doctor office.  Instructed to come to the ED for further evaluation.    Prior to Admission medications   Medication Sig Start Date End Date Taking? Authorizing Provider  adalimumab (HUMIRA, 2 PEN,) 40 MG/0.4ML pen Inject into the skin.    [provider]  Multiple Vitamin (MULTIVITAMIN) capsule as directed Orally    [provider]  norethindrone  (MICRONOR ) 0.35 MG tablet Take 1 tablet (0.35 mg total) by mouth daily. 03/11/23   Cleotilde Ronal RAMAN, MD  valACYclovir (VALTREX) 1000 MG tablet Take 1,000 mg by mouth 2 (two) times daily. As needed for flares    [provider]    Allergies: Amoxil [amoxicillin]    Review of Systems  Updated Vital Signs BP 115/81   Pulse 62   Temp 98.7 F (37.1 C)   Resp 15   Ht 1.676 m (5' 6)   Wt 96.2 kg   SpO2 100%   BMI 34.22 kg/m   Physical Exam Vitals and nursing note reviewed.  Constitutional:      General: She is not in acute distress.    Appearance: She is well-developed.  HENT:     Head: Normocephalic and atraumatic.     Right Ear: External ear normal.     Left Ear: External ear normal.  Eyes:     General: No visual field deficit or scleral icterus.       Right eye: No discharge.        Left eye: No discharge.     Conjunctiva/sclera: Conjunctivae normal.  Neck:     Trachea: No tracheal deviation.  Cardiovascular:     Rate and Rhythm: Normal rate and regular rhythm.  Pulmonary:     Effort: Pulmonary effort is normal. No respiratory  distress.     Breath sounds: Normal breath sounds. No stridor. No wheezing or rales.  Abdominal:     General: Bowel sounds are normal. There is no distension.     Palpations: Abdomen is soft.     Tenderness: There is no abdominal tenderness. There is no guarding or rebound.  Musculoskeletal:        General: No tenderness.     Cervical back: Neck supple.  Skin:    General: Skin is warm and dry.     Findings: No rash.  Neurological:     Mental Status: She is alert and oriented to person, place, and time.     Cranial Nerves: No cranial nerve deficit, dysarthria or facial asymmetry.     Sensory: No sensory deficit.     Motor: No abnormal muscle tone, seizure activity or pronator drift.     Coordination: Coordination normal.     Comments:  able to hold both legs off bed for 5 seconds, sensation intact in all extremities,  no left or right sided neglect, normal finger-nose exam bilaterally, no nystagmus noted   Psychiatric:        Mood and Affect: Mood normal.     (all labs  ordered are listed, but only abnormal results are displayed) Labs Reviewed  COMPREHENSIVE METABOLIC PANEL WITH GFR - Abnormal; Notable for the following components:      Result Value   CO2 20 (*)    ALT 58 (*)    All other components within normal limits  CBC    EKG: None  Radiology: CT Head Wo Contrast Result Date: 11/01/2023 EXAM: CT HEAD WITHOUT CONTRAST 11/01/2023 09:30:05 PM TECHNIQUE: CT of the head was performed without the administration of intravenous contrast. Automated exposure control, iterative reconstruction, and/or weight based adjustment of the mA/kV was utilized to reduce the radiation dose to as low as reasonably achievable. COMPARISON: None available. CLINICAL HISTORY: Numbness mostly left sided. Pt presents to ED from home C/O medication reaction. Pt reports tingling to bilateral hands and feet right after taking new medication. States infectious disease started her on new medication for  latent TB. FINDINGS: BRAIN AND VENTRICLES: No acute hemorrhage. No evidence of acute infarct. No hydrocephalus. No extra-axial collection. No mass effect or midline shift. ORBITS: No acute abnormality. SINUSES: No acute abnormality. SOFT TISSUES AND SKULL: No acute soft tissue abnormality. No skull fracture. IMPRESSION: 1. No acute intracranial abnormality. Electronically signed by: Dorethia Molt MD 11/01/2023 09:42 PM EDT RP Workstation: [id number]     Procedures   Medications Ordered in the ED - No data to display  Clinical Course as of 11/02/23 1900  Mon Nov 01, 2023  2156 Head CT without acute changes [JK]    Clinical Course User Index [JK] Randol Simmonds, MD                                 Medical Decision Making Amount and/or Complexity of Data Reviewed Radiology: ordered.   Pt presented with an episode of numbness, lightheadedness after starting a new medication.  TIA, stroke considered.  Neuro exam normal.  CT normal.  Overall, doubt stroke TIA with bilateral nature of sx involving hands and feet.  NO anemia, electrolyte imbalance.  Evaluation and diagnostic testing in the emergency department does not suggest an emergent condition requiring admission or immediate intervention beyond what has been performed at this time.  The patient is safe for discharge and has been instructed to return immediately for worsening symptoms, change in symptoms or any other concerns.      Final diagnoses:  Paresthesia    ED Discharge Orders     None          Randol Simmonds, MD 11/02/23 1902

## 2024-01-06 ENCOUNTER — Other Ambulatory Visit: Payer: Self-pay | Admitting: Family Medicine

## 2024-01-06 DIAGNOSIS — Z1231 Encounter for screening mammogram for malignant neoplasm of breast: Secondary | ICD-10-CM

## 2024-02-14 ENCOUNTER — Ambulatory Visit
Admission: RE | Admit: 2024-02-14 | Discharge: 2024-02-14 | Disposition: A | Source: Ambulatory Visit | Attending: Family Medicine

## 2024-02-14 DIAGNOSIS — Z1231 Encounter for screening mammogram for malignant neoplasm of breast: Secondary | ICD-10-CM

## 2024-03-13 ENCOUNTER — Ambulatory Visit (HOSPITAL_BASED_OUTPATIENT_CLINIC_OR_DEPARTMENT_OTHER): Payer: 59 | Admitting: Obstetrics & Gynecology

## 2024-03-29 ENCOUNTER — Ambulatory Visit (HOSPITAL_BASED_OUTPATIENT_CLINIC_OR_DEPARTMENT_OTHER): Admitting: Obstetrics & Gynecology
